# Patient Record
Sex: Male | Born: 1980 | Race: White | Hispanic: No | Smoking: Never smoker
Health system: Southern US, Community
[De-identification: ages and names within clinical notes are randomized; demographics above are authoritative.]

## PROBLEM LIST (undated history)

## (undated) DIAGNOSIS — C179 Malignant neoplasm of small intestine, unspecified: Secondary | ICD-10-CM

## (undated) DIAGNOSIS — J45909 Unspecified asthma, uncomplicated: Secondary | ICD-10-CM

## (undated) DIAGNOSIS — I456 Pre-excitation syndrome: Secondary | ICD-10-CM

## (undated) HISTORY — PX: NEUROFIBROMA EXCISION: SHX2089

## (undated) HISTORY — PX: SMALL INTESTINE SURGERY: SHX150

## (undated) HISTORY — DX: Malignant neoplasm of small intestine, unspecified: C17.9

## (undated) HISTORY — PX: ABDOMINAL SURGERY: SHX537

## (undated) HISTORY — DX: Unspecified asthma, uncomplicated: J45.909

## (undated) HISTORY — PX: CARDIAC ELECTROPHYSIOLOGY MAPPING AND ABLATION: SHX1292

---

## 2021-10-07 NOTE — Progress Notes (Deleted)
  [facility]  [address] [place],  [place]  [address] [phone]  Clinic Day:  10/07/2021  Referring physician: No ref. provider found   HISTORY OF PRESENT ILLNESS:  The patient is a 40 y.o. 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  PAST MEDICAL HISTORY:  No past medical history on file.  PAST SURGICAL HISTORY:  *** The histories are not reviewed yet. Please review them in the "History" navigator section and refresh this [place].  CURRENT MEDICATIONS:   No current outpatient medications on file.   No current facility-administered medications for this visit.    ALLERGIES:  Not on File  FAMILY HISTORY:  No family history on file.  SOCIAL HISTORY:   has no history on file for tobacco use, alcohol use, and drug use.  REVIEW OF SYSTEMS:  Review of Systems - Oncology   PHYSICAL EXAM:  There were no vitals taken for this visit. Wt Readings from Last 3 Encounters:  No data found for Wt   There is no height or weight on file to calculate BMI. Performance status (ECOG): {CHL ONC Q3448304 Physical Exam .phy  LABS:  No flowsheet data found. No flowsheet data found.   No results found for: CEA1 / No results found for: CEA1 No results found for: PSA1 No results found for: JFH545 No results found for: CAN125  No results found for: TOTALPROTELP, ALBUMINELP, A1GS, A2GS, BETS, BETA2SER, GAMS, MSPIKE, SPEI No results found for: TIBC, FERRITIN, IRONPCTSAT No results found for: LDH  No results found for: AFPTUMOR, TOTALPROTELP, ALBUMINELP, A1GS, A2GS, BETS, BETA2SER, GAMS, MSPIKE, SPEI, LDH, CEA1, PSA1, IGASERUM, IGGSERUM, IGMSERUM, THGAB, THYROGLB  Recent Review Flowsheet Data   There is no flowsheet data to display.     STUDIES:  No results found.   ASSESSMENT & PLAN:  A 40 y.o. male who I was asked to consult upon for *** .The patient understands all the plans  discussed today and is in agreement with them.  I do appreciate No ref. provider found for his new consult.   Zniyah Midkiff Macarthur Critchley, MD

## 2021-10-08 ENCOUNTER — Other Ambulatory Visit: Payer: Self-pay

## 2021-10-08 ENCOUNTER — Ambulatory Visit: Payer: Self-pay | Admitting: Oncology

## 2021-10-17 NOTE — Progress Notes (Incomplete)
°  [facility]  [address] [place],  [place]  [address] [phone]  Clinic Day:  10/17/2021  Referring physician: No ref. provider found   HISTORY OF PRESENT ILLNESS:  The patient is a 40 y.o. male  *** who I was asked to consult upon for ***   PAST MEDICAL HISTORY:  No past medical history on file.  PAST SURGICAL HISTORY:  *** The histories are not reviewed yet. Please review them in the "History" navigator section and refresh this [place].  CURRENT MEDICATIONS:   No current outpatient medications on file.   No current facility-administered medications for this visit.    ALLERGIES:  Not on File  FAMILY HISTORY:  No family history on file.  SOCIAL HISTORY:   has no history on file for tobacco use, alcohol use, and drug use.  REVIEW OF SYSTEMS:  Review of Systems - Oncology   PHYSICAL EXAM:  There were no vitals taken for this visit. Wt Readings from Last 3 Encounters:  No data found for Wt   There is no height or weight on file to calculate BMI. Performance status (ECOG): {CHL ONC Q3448304 Physical Exam .phy  LABS:  No flowsheet data found. No flowsheet data found.   No results found for: CEA1 / No results found for: CEA1 No results found for: PSA1 No results found for: WER154 No results found for: CAN125  No results found for: TOTALPROTELP, ALBUMINELP, A1GS, A2GS, BETS, BETA2SER, GAMS, MSPIKE, SPEI No results found for: TIBC, FERRITIN, IRONPCTSAT No results found for: LDH  No results found for: AFPTUMOR, TOTALPROTELP, ALBUMINELP, A1GS, A2GS, BETS, BETA2SER, GAMS, MSPIKE, SPEI, LDH, CEA1, PSA1, IGASERUM, IGGSERUM, IGMSERUM, THGAB, THYROGLB  Recent Review Flowsheet Data   There is no flowsheet data to display.     STUDIES:  No results found.   ASSESSMENT & PLAN:  A 40 y.o. male who I was asked to consult upon for *** .The patient understands all the plans discussed today and is in agreement with  them.  I do appreciate No ref. provider found for his new consult.   Abdulloh Ullom Macarthur Critchley, MD

## 2021-10-18 ENCOUNTER — Other Ambulatory Visit: Payer: Self-pay

## 2021-10-18 ENCOUNTER — Ambulatory Visit: Payer: Self-pay | Admitting: Oncology

## 2021-10-31 ENCOUNTER — Telehealth: Payer: Self-pay | Admitting: Oncology

## 2021-10-31 NOTE — Telephone Encounter (Signed)
Patient reschedule to 12/5 Labs 1:30 pm - Consult for GITS 2:00 pm.  Informed patient it is important that he makes this Appt

## 2021-11-18 ENCOUNTER — Other Ambulatory Visit: Payer: Self-pay | Admitting: Oncology

## 2021-11-18 DIAGNOSIS — C49A3 Gastrointestinal stromal tumor of small intestine: Secondary | ICD-10-CM

## 2021-11-18 NOTE — Progress Notes (Signed)
[facility]  [address] [place],  [place]  [address] [phone]  Clinic Day:  11/19/2021  Referring physician: [facility]   HISTORY OF PRESENT ILLNESS:  The patient is a 40 y.o. 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   His history dates back to July 2021 when he presented to a [place] hospital with severe abdominal pain.  While there, scans showed a small bowel blockage secondary to a suspicious mass.  The patient claims he had to undergo emergency surgery to address his small bowel obstruction.  The pathology from that surgery revealed a mass consistent with GIST tumor.  This gentleman was apparently placed on Gleevec afterwards, which he took until June 2022.  Per outside records, his last CT scan done in February 2022 showed a small nodule adjacent to his sigmoid colon that was concerning for recurrent/persistent disease.  The patient moved to this area in June 2022.  He claims he has not taken his [place] since then.  Despite this, the patient denies having any abdominal pain, changes in his bowel habits, or other GI symptoms which concern him for overt signs of disease recurrence.  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     aspirin EC 81 MG tablet Take 81 mg by mouth daily. Swallow whole.     famotidine (PEPCID) 10 MG tablet Take 10 mg by mouth 2 (two) times daily.     imatinib (GLEEVEC) 100 MG tablet Take 100 mg by mouth daily. Take with meals and large glass of  water.Caution:Chemotherapy     amLODipine (NORVASC) 10 MG tablet Take 10 mg by mouth every morning.     tadalafil (CIALIS) 5 MG tablet Take 5 mg by mouth every morning.     No current facility-administered medications for this visit.    ALLERGIES:  No Known Allergies  FAMILY HISTORY:   Family History  Problem Relation Age of Onset   Leukemia Maternal Uncle    Pancreatic cancer Paternal Uncle    Leukemia Maternal Grandfather   He has a sister with Down syndrome.    SOCIAL HISTORY:  The patient was born and raised in [place], [place].  He currently lives in town by himself.  He is not married and does not have any children.  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  He also worked at a [employer] systems.  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  He drinks an occasional beer.  He denies a history of smoking.  REVIEW OF SYSTEMS:  Review of Systems  Constitutional:  Negative for fatigue, fever and unexpected weight change.  Respiratory:  Negative for chest tightness, cough, hemoptysis and shortness of breath.   Cardiovascular:  Negative for chest pain and palpitations.  Gastrointestinal:  Negative for abdominal distention, abdominal pain, blood in stool, constipation, diarrhea, nausea and vomiting.  Genitourinary:  Negative for dysuria, frequency and hematuria.   Musculoskeletal:  Positive for back pain. Negative for arthralgias and myalgias.  Skin:  Negative for itching and rash.  Neurological:  Negative  for dizziness, headaches and light-headedness.  Psychiatric/Behavioral:  Negative for depression and suicidal ideas. The patient is not nervous/anxious.     PHYSICAL EXAM:  Blood pressure (!) 156/97, pulse 83, temperature 97.9 F (36.6 C), resp. rate 16, height 5\' 6"  (1.676 m), weight 265 lb 11.2 oz (120.5 kg), SpO2 93 %. Wt Readings from Last 3 Encounters:  11/19/21 265 lb 11.2 oz (120.5 kg)   Body mass index is 42.89 kg/m. Performance status (ECOG): 0 -  Asymptomatic Physical Exam Constitutional:      Appearance: Normal appearance. He is not ill-appearing.  HENT:     Mouth/Throat:     Mouth: Mucous membranes are moist.     Pharynx: Oropharynx is clear. No oropharyngeal exudate or posterior oropharyngeal erythema.  Cardiovascular:     Rate and Rhythm: Normal rate and regular rhythm.     Heart sounds: No murmur heard.   No friction rub. No gallop.  Pulmonary:     Effort: Pulmonary effort is normal. No respiratory distress.     Breath sounds: Normal breath sounds. No wheezing, rhonchi or rales.  Abdominal:     General: Bowel sounds are normal. There is no distension.     Palpations: Abdomen is soft. There is no mass.     Tenderness: There is no abdominal tenderness.  Musculoskeletal:        General: No swelling.     Right lower leg: No edema.     Left lower leg: No edema.  Lymphadenopathy:     Cervical: No cervical adenopathy.     Upper Body:     Right upper body: No supraclavicular or axillary adenopathy.     Left upper body: No supraclavicular or axillary adenopathy.     Lower Body: No right inguinal adenopathy. No left inguinal adenopathy.  Skin:    General: Skin is warm.     Coloration: Skin is not jaundiced.     Findings: No lesion or rash.     Comments: Multiple flesh-colored nodules and caf au lait spots seen over his body  Neurological:     General: No focal deficit present.     Mental Status: He is alert and oriented to person, place, and time. Mental status is at baseline.  Psychiatric:        Mood and Affect: Mood normal.        Behavior: Behavior normal.        Thought Content: Thought content normal.    LABS:   CBC Latest Ref Rng & Units 11/19/2021  WBC - 6.4  Hemoglobin 13.5 - 17.5 15.3  Hematocrit 41 - 53 44  Platelets 150 - 399 205   CMP Latest Ref Rng & Units 11/19/2021  BUN 4 - 21 21  Creatinine 0.6 - 1.3 1.2  Sodium 137 - 147 142  Potassium 3.4 - 5.3 4.0  Chloride 99 - 108 106  CO2 13 - 22 26(A)   Calcium 8.7 - 10.7 9.0  Alkaline Phos 25 - 125 64  AST 14 - 40 27  ALT 10 - 40 23   ASSESSMENT & PLAN:  A 40 y.o. 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  Clinically, the patient appears to be doing well.  As mentioned previously, this gentleman has not had a scan since February 2022.  However, at that time, there was a small nodule adjacent to his sigmoid colon that was worrisome for persistent disease.  I  will repeat CT scans in the forthcoming weeks to reassess his disease status.  I do anticipate placing this gentleman back on Gleevec in the immediate future.  Of note, all information gathered today is mostly from this gentleman's recollection.  I do not have his official gist tumor pathology report from [place].  We will try to ascertain this information as quickly as possible.  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  I will have him see genetic counseling to undergo a formal workup to prove this diagnosis.  If he has neurofibromatosis, it will be important for him to undergo the necessary screening procedures to rule out other malignancies which could potentially develop over time.  Otherwise, I will see this patient back in 2 weeks to go over his CT scan images and their implications.  The patient understands all the plans discussed today and is in agreement with them.  I do appreciate the [facility] clinic for this new consult.   Krissi Willaims Macarthur Critchley, MD

## 2021-11-19 ENCOUNTER — Other Ambulatory Visit: Payer: Self-pay | Admitting: Hematology and Oncology

## 2021-11-19 ENCOUNTER — Inpatient Hospital Stay: Payer: Self-pay | Attending: Oncology

## 2021-11-19 ENCOUNTER — Other Ambulatory Visit: Payer: Self-pay | Admitting: Oncology

## 2021-11-19 ENCOUNTER — Encounter: Payer: Self-pay | Admitting: Oncology

## 2021-11-19 ENCOUNTER — Inpatient Hospital Stay (HOSPITAL_BASED_OUTPATIENT_CLINIC_OR_DEPARTMENT_OTHER): Payer: Self-pay | Admitting: Oncology

## 2021-11-19 VITALS — BP 156/97 | HR 83 | Temp 97.9°F | Resp 16 | Ht 66.0 in | Wt 265.7 lb

## 2021-11-19 DIAGNOSIS — Z808 Family history of malignant neoplasm of other organs or systems: Secondary | ICD-10-CM

## 2021-11-19 DIAGNOSIS — Q8500 Neurofibromatosis, unspecified: Secondary | ICD-10-CM

## 2021-11-19 DIAGNOSIS — C49A0 Gastrointestinal stromal tumor, unspecified site: Secondary | ICD-10-CM | POA: Insufficient documentation

## 2021-11-19 DIAGNOSIS — Z806 Family history of leukemia: Secondary | ICD-10-CM

## 2021-11-19 DIAGNOSIS — C49A3 Gastrointestinal stromal tumor of small intestine: Secondary | ICD-10-CM

## 2021-11-19 LAB — CBC AND DIFFERENTIAL
HCT: 44 (ref 41–53)
Hemoglobin: 15.3 (ref 13.5–17.5)
Neutrophils Absolute: 3.65
Platelets: 205 (ref 150–399)
WBC: 6.4

## 2021-11-19 LAB — HEPATIC FUNCTION PANEL
ALT: 23 (ref 10–40)
AST: 27 (ref 14–40)
Alkaline Phosphatase: 64 (ref 25–125)
Bilirubin, Total: 0.6

## 2021-11-19 LAB — CBC: RBC: 5.16 — AB (ref 3.87–5.11)

## 2021-11-19 LAB — BASIC METABOLIC PANEL
BUN: 21 (ref 4–21)
CO2: 26 — AB (ref 13–22)
Chloride: 106 (ref 99–108)
Creatinine: 1.2 (ref 0.6–1.3)
Glucose: 102
Potassium: 4 (ref 3.4–5.3)
Sodium: 142 (ref 137–147)

## 2021-11-19 LAB — COMPREHENSIVE METABOLIC PANEL
Albumin: 4.6 (ref 3.5–5.0)
Calcium: 9 (ref 8.7–10.7)

## 2021-11-20 ENCOUNTER — Telehealth: Payer: Self-pay | Admitting: Genetic Counselor

## 2021-11-20 NOTE — Telephone Encounter (Signed)
Scheduled appt per 12/5 referral. Pt is aware of appt date and time. Pt preferred in person visit in [place].

## 2021-11-20 NOTE — Progress Notes (Signed)
Called and spoke with patient about applying for financial assistance through [facility] and [facility]. He will be in Friday, 11/23/2021, to fill out paperwork and bring in his financial paperwork.

## 2021-11-21 ENCOUNTER — Telehealth: Payer: Self-pay

## 2021-11-21 ENCOUNTER — Other Ambulatory Visit (HOSPITAL_COMMUNITY): Payer: Self-pay

## 2021-11-21 NOTE — Telephone Encounter (Signed)
Oral Oncology Pharmacist Encounter  Received new prescription for imatinib (Gleevec) for the treatment of gastrointestinal stromal tumor, planned duration until disease progression or unacceptable toxicity.  Labs from 11/19/21 assessed, no interventions needed.  Per MD note, patient has been on [place] prior although hasn't taken the medication for months.  Current medication list in Epic reviewed, DDIs with Gleevec identified: -amlodipine: gleevec can increase the concentration of amlodipine and will need to be monitored for increased side effects like hypotension and edema. -tadalafil: gleevec can increase the concentraiton of tadalafil and will need to be monitored for increased adverse effects.   Evaluated chart and no patient barriers to medication adherence noted.   Patient agreement for treatment documented in MD note on 11/19/2021.  Prescription has been e-scribed to the [facility] for benefits analysis and approval.  Oral Oncology Clinic will continue to follow for insurance authorization, copayment issues, initial counseling and start date.  Drema Halon, PharmD Hematology/Oncology Clinical Pharmacist [facility] [phone] 11/21/2021 2:19 PM

## 2021-11-27 ENCOUNTER — Telehealth: Payer: Self-pay | Admitting: Pharmacy Technician

## 2021-11-27 NOTE — Telephone Encounter (Signed)
Patient is uninsured. And there currently are no patient assistance available for Gleevac since it has gone generic.  Per [facility] website, patient could get 1 month supply via mail for $39.00.  Patient would need to enroll for program and provide office with his email address used to sign up. Email must be included on prescription when sent to the pharmacy.  Called patient to discuss, left voicemail.

## 2021-11-28 DIAGNOSIS — Q8500 Neurofibromatosis, unspecified: Secondary | ICD-10-CM | POA: Insufficient documentation

## 2021-11-28 NOTE — Telephone Encounter (Signed)
ACP, no answer/ left message to return call

## 2021-11-29 NOTE — Telephone Encounter (Signed)
Spoke to patient, he has insurance open enrollment for his job in February. Patient will like to sign up for [facility] pharmacy for the interim.   Emailed patient the [facility] pharmacy website link to sign up- [email or web address].

## 2021-11-29 NOTE — Progress Notes (Incomplete)
[facility]  [address] [place],  [place]  [address] [phone]  Clinic Day:  12/04/2021  Referring physician: [facility]  This document serves as a record of services personally performed by Marice Potter, MD. It was created on their behalf by Curry,Lauren E, a trained medical scribe. The creation of this record is based on the scribe's personal observations and the provider's statements to them.  HISTORY OF PRESENT ILLNESS:  The patient is a 40 y.o. 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   His history dates back to July 2021 when he presented to a [place] hospital with severe abdominal pain.  While there, scans showed a small bowel blockage secondary to a suspicious mass.  The patient claims he had to undergo emergency surgery to address his small bowel obstruction.  The pathology from that surgery revealed a mass consistent with GIST tumor.  This gentleman was apparently placed on Gleevec afterwards, which he took until June 2022.  Per outside records, his last CT scan done in February 2022 showed a small nodule adjacent to his sigmoid colon that was concerning for recurrent/persistent disease.  The patient moved to this area in June 2022.  He claims he has not taken his [place] since then.  Despite this, the patient denies having any abdominal pain, changes in his bowel habits, or other GI symptoms which concern him for overt signs of disease recurrence.  PHYSICAL EXAM:  There were no vitals taken for this visit. Wt Readings from Last 3 Encounters:  11/19/21 265 lb 11.2 oz (120.5 kg)   There is no height or weight on file to calculate BMI. Performance status (ECOG): 0 - Asymptomatic Physical Exam Constitutional:      Appearance: Normal appearance. He is not ill-appearing.  HENT:     Mouth/Throat:     Mouth: Mucous membranes are moist.     Pharynx: Oropharynx is  clear. No oropharyngeal exudate or posterior oropharyngeal erythema.  Cardiovascular:     Rate and Rhythm: Normal rate and regular rhythm.     Heart sounds: No murmur heard.   No friction rub. No gallop.  Pulmonary:     Effort: Pulmonary effort is normal. No respiratory distress.     Breath sounds: Normal breath sounds. No wheezing, rhonchi or rales.  Abdominal:     General: Bowel sounds are normal. There is no distension.     Palpations: Abdomen is soft. There is no mass.     Tenderness: There is no abdominal tenderness.  Musculoskeletal:        General: No swelling.     Right lower leg: No edema.     Left lower leg: No edema.  Lymphadenopathy:     Cervical: No cervical adenopathy.     Upper Body:     Right upper body: No supraclavicular or axillary adenopathy.     Left upper body: No supraclavicular or axillary adenopathy.     Lower Body: No right inguinal adenopathy. No left inguinal adenopathy.  Skin:    General: Skin is warm.     Coloration: Skin is not jaundiced.     Findings: No lesion or rash.     Comments: Multiple flesh-colored nodules and caf au lait spots seen over his body  Neurological:     General: No focal deficit present.     Mental Status: He is alert and oriented to person, place, and time. Mental  status is at baseline.  Psychiatric:        Mood and Affect: Mood normal.        Behavior: Behavior normal.        Thought Content: Thought content normal.    LABS:   CBC Latest Ref Rng & Units 11/19/2021  WBC - 6.4  Hemoglobin 13.5 - 17.5 15.3  Hematocrit 41 - 53 44  Platelets 150 - 399 205   CMP Latest Ref Rng & Units 11/19/2021  BUN 4 - 21 21  Creatinine 0.6 - 1.3 1.2  Sodium 137 - 147 142  Potassium 3.4 - 5.3 4.0  Chloride 99 - 108 106  CO2 13 - 22 26(A)  Calcium 8.7 - 10.7 9.0  Alkaline Phos 25 - 125 64  AST 14 - 40 27  ALT 10 - 40 23   ASSESSMENT & PLAN:  A 40 y.o. male who I was asked to consult upon for the continued management of his small  bowel GIST tumor.  Clinically, the patient appears to be doing well.  As mentioned previously, this gentleman has not had a scan since February 2022.  However, at that time, there was a small nodule adjacent to his sigmoid colon that was worrisome for persistent disease.  I will repeat CT scans in the forthcoming weeks to reassess his disease status.  I do anticipate placing this gentleman back on Gleevec in the immediate future.  Of note, all information gathered today is mostly from this gentleman's recollection.  I do not have his official gist tumor pathology report from [place].  We will try to ascertain this information as quickly as possible.  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  I will have him see genetic counseling to undergo a formal workup to prove this diagnosis.  If he has neurofibromatosis, it will be important for him to undergo the necessary screening procedures to rule out other malignancies which could potentially develop over time.  Otherwise, I will see this patient back in 2 weeks to go over his CT scan images and their implications.  The patient understands all the plans discussed today and is in agreement with them.  I, Rita Ohara, am acting as scribe for Marice Potter, MD    I have reviewed this report as typed by the medical scribe, and it is complete and accurate.  Dequincy Macarthur Critchley, MD

## 2021-11-30 NOTE — Telephone Encounter (Signed)
Spoke to patient, he advised that he was able to set up his [employer] [facility] account. Sent notification to pharmacist to advise clinic can send in prescription when they are ready for patient to start.

## 2021-12-03 NOTE — Progress Notes (Signed)
Spoke with patient on the phone today, he has not made it in to fill out his financial assistance paperwork for [place] and [facility]. He is out of town at the moment, and said he would come in after the holidays to met with me.  °

## 2021-12-04 ENCOUNTER — Ambulatory Visit: Payer: Self-pay | Admitting: Oncology

## 2021-12-18 ENCOUNTER — Inpatient Hospital Stay: Payer: Self-pay | Attending: Oncology | Admitting: Genetic Counselor

## 2021-12-18 ENCOUNTER — Inpatient Hospital Stay: Payer: Self-pay

## 2021-12-25 NOTE — Progress Notes (Signed)
Called patient and reached his voicemail, left a message asking him to call me back.

## 2021-12-25 NOTE — Telephone Encounter (Signed)
Oral Oncology Pharmacist Encounter  Patient has not rescheduled appointment or received new scans. Per MD, patient will not restart gleevec until after the scans are reviewed and MD sees patient.   Will close encounter for the moment until medication course is decided.   Drema Halon, PharmD Hematology/Oncology Clinical Pharmacist [facility] [phone]

## 2021-12-31 ENCOUNTER — Telehealth: Payer: Self-pay | Admitting: Oncology

## 2021-12-31 NOTE — Progress Notes (Signed)
Patient was in today, we filled out paperwork for financial assistance for [facility] health and [facility]. He provided me with his pay stubs, but I will still need his 1040, bank statements, and a bill in his name. He said that he would bring this back in to me once he has it.

## 2021-12-31 NOTE — Telephone Encounter (Signed)
Patient came in today to schedule his CT scans as well his provider appt. Pt has been r/s'd for all appts. He was given information for CT and given appt dates and times for his follow-up with an appt calendar.

## 2021-12-31 NOTE — Progress Notes (Signed)
Sent in financial application to [facility] and [employer].

## 2022-01-01 NOTE — Progress Notes (Signed)
[place]  [address] [place],    [id number] [phone]  Clinic Day:  01/07/2022  Referring physician: [facility]  This document serves as a record of services personally performed by Marice Potter, MD. It was created on their behalf by Curry,Lauren E, a trained medical scribe. The creation of this record is based on the scribe's personal observations and the provider's statements to them.  HISTORY OF PRESENT ILLNESS:  The patient is a 41 y.o. 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  This gentleman was placed on Gleevec afterwards, which he took until June 2022, which is around the time he moved to this area.  He comes in today to go over his CT scans to ascertain his new disease baseline.  Per outside records, his last CT scan done in February 2022 showed a small nodule adjacent to his sigmoid colon that was concerning for recurrent/persistent disease.  Overall, the patient has been doing well.  He denies having any new GI symptoms which concern him for disease recurrence.    PHYSICAL EXAM:  Blood pressure (!) 142/91, pulse 73, temperature 97.8 F (36.6 C), resp. rate 16, height 5\' 6"  (1.676 m), weight 265 lb 6.4 oz (120.4 kg), SpO2 96 %. Wt Readings from Last 3 Encounters:  01/07/22 265 lb 6.4 oz (120.4 kg)  11/19/21 265 lb 11.2 oz (120.5 kg)   Body mass index is 42.84 kg/m. Performance status (ECOG): 0 - Asymptomatic Physical Exam Constitutional:      Appearance: Normal appearance. He is not ill-appearing.  HENT:     Mouth/Throat:     Mouth: Mucous membranes are moist.     Pharynx: Oropharynx is clear. No oropharyngeal exudate or posterior oropharyngeal erythema.  Cardiovascular:     Rate and Rhythm: Normal rate and regular rhythm.     Heart sounds: No murmur heard.   No friction rub. No gallop.  Pulmonary:      Effort: Pulmonary effort is normal. No respiratory distress.     Breath sounds: Normal breath sounds. No wheezing, rhonchi or rales.  Abdominal:     General: Bowel sounds are normal. There is no distension.     Palpations: Abdomen is soft. There is no mass.     Tenderness: There is no abdominal tenderness.  Musculoskeletal:        General: No swelling.     Right lower leg: No edema.     Left lower leg: No edema.  Lymphadenopathy:     Cervical: No cervical adenopathy.     Upper Body:     Right upper body: No supraclavicular or axillary adenopathy.     Left upper body: No supraclavicular or axillary adenopathy.     Lower Body: No right inguinal adenopathy. No left inguinal adenopathy.  Skin:    General: Skin is warm.     Coloration: Skin is not jaundiced.     Findings: No lesion or rash.     Comments: Multiple flesh-colored nodules and caf au lait spots seen over his body  Neurological:     General: No focal deficit present.     Mental Status: He is alert and oriented to person, place, and time. Mental status is at baseline.  Psychiatric:        Mood and Affect: Mood normal.        Behavior: Behavior normal.  Thought Content: Thought content normal.   SCANS: Recent CT imaging has revealed the following:  FINDINGS: CT CHEST FINDINGS Cardiovascular: No significant vascular findings. Normal heart size. No pericardial effusion. Mediastinum/Nodes: No enlarged mediastinal, hilar, or axillary lymph nodes. Thyroid gland, trachea, and esophagus demonstrate no significant findings. Lungs/Pleura: Lungs are clear. No pleural effusion or pneumothorax. Musculoskeletal: No chest wall mass or suspicious osseous lesions identified.  CT ABDOMEN PELVIS FINDINGS Hepatobiliary: No solid liver abnormality is seen. No gallstones, gallbladder wall thickening, or biliary dilatation. Pancreas: Unremarkable. No pancreatic ductal dilatation or surrounding inflammatory changes. Spleen: Normal  in size without significant abnormality. Adrenals/Urinary Tract: Adrenal glands are unremarkable. Multiple small bilateral simple renal cysts. Kidneys are otherwise normal, without renal calculi, solid lesion, or hydronephrosis. Bladder is unremarkable. Stomach/Bowel: Stomach is within normal limits. Appendix appears normal. No evidence of bowel wall thickening, distention, or inflammatory changes. Vascular/Lymphatic: No significant vascular findings are present. Multiple prominent lymph nodes in the central small bowel mesentery, measuring up to 1.4 x 1.0 cm (series 2, image 69). No overtly enlarged abdominal or pelvic lymph nodes. Reproductive: No mass or other abnormality. Other: Small, fat containing midline ventral epigastric hernia (series 2, image 76). Small, fat containing umbilical hernia (series 2, image 84). Small, fat containing left inguinal hernia (series 2, image 119). No ascites. Musculoskeletal: No acute osseous findings.  IMPRESSION: 1. No specific evidence of recurrent or metastatic GIST in the chest, abdomen, or pelvis. Comparison to prior imaging and nature of reported prior surgery may be helpful. 2. Multiple prominent lymph nodes in the central small bowel mesentery, nonspecific. As above, comparison to prior imaging may be helpful to assess for change. No overtly enlarged lymph nodes in the chest, abdomen, or pelvis.  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  In clinic today, I went over all of his CT scan images with him, for which he could see there is no evidence of any disease recurrence.  As mentioned previously, this gentleman did take [place] for approximately 1 year after his surgery.  From my vantage point, as he is disease-free, I see no need for him to get back on [place] therapy.  Clinically, he is doing well.  From a surveillance standpoint, I will see him back in 6 months for repeat clinical assessment.  CT scans will  be done a day before his next visit to ensure there remains no radiographic evidence of disease recurrence.  The patient understands all the plans discussed today and is in agreement with them.   I, Rita Ohara, am acting as scribe for Marice Potter, MD    I have reviewed this report as typed by the medical scribe, and it is complete and accurate.  Navy Rothschild Macarthur Critchley, MD

## 2022-01-07 ENCOUNTER — Other Ambulatory Visit: Payer: Self-pay

## 2022-01-07 ENCOUNTER — Other Ambulatory Visit: Payer: Self-pay | Admitting: Oncology

## 2022-01-07 ENCOUNTER — Inpatient Hospital Stay (HOSPITAL_BASED_OUTPATIENT_CLINIC_OR_DEPARTMENT_OTHER): Payer: Self-pay | Admitting: Oncology

## 2022-01-07 VITALS — BP 142/91 | HR 73 | Temp 97.8°F | Resp 16 | Ht 66.0 in | Wt 265.4 lb

## 2022-01-07 DIAGNOSIS — C49A3 Gastrointestinal stromal tumor of small intestine: Secondary | ICD-10-CM

## 2022-01-15 ENCOUNTER — Encounter: Payer: Self-pay | Admitting: Oncology

## 2022-01-18 ENCOUNTER — Inpatient Hospital Stay: Payer: Self-pay

## 2022-01-18 ENCOUNTER — Inpatient Hospital Stay: Payer: Self-pay | Attending: Oncology | Admitting: Genetic Counselor

## 2022-01-18 ENCOUNTER — Other Ambulatory Visit: Payer: Self-pay

## 2022-01-18 ENCOUNTER — Other Ambulatory Visit: Payer: Self-pay | Admitting: Genetic Counselor

## 2022-01-18 DIAGNOSIS — Z800 Family history of malignant neoplasm of digestive organs: Secondary | ICD-10-CM

## 2022-01-18 DIAGNOSIS — C49A3 Gastrointestinal stromal tumor of small intestine: Secondary | ICD-10-CM

## 2022-01-18 NOTE — Addendum Note (Signed)
Addended by: Ignacia Bayley on: 01/18/2022 10:27 AM   Modules accepted: Orders

## 2022-01-27 ENCOUNTER — Encounter: Payer: Self-pay | Admitting: Genetic Counselor

## 2022-01-27 DIAGNOSIS — Z800 Family history of malignant neoplasm of digestive organs: Secondary | ICD-10-CM

## 2022-01-27 HISTORY — DX: Family history of malignant neoplasm of digestive organs: Z80.0

## 2022-01-27 NOTE — Progress Notes (Signed)
REFERRING PROVIDER: Weston Settle, MD [address] [place],  [place] [address]  PRIMARY PROVIDER:  Healthcare, [facility]  PRIMARY REASON FOR VISIT:  1. 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 Family history of pancreatic cancer     HISTORY OF PRESENT ILLNESS:   Ryan Waters, a 41 y.o. male, was seen for a  cancer genetics consultation at the request of Dr. Melvyn Neth due to a personal and family history of cancer and a personal history of neurofibromas.  Ryan Waters presents to clinic today to discuss the possibility of a hereditary predisposition to cancer, to discuss genetic testing, and to further clarify his future cancer risks, as well as potential cancer risks for family members.   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  He took Gleevec until June 2022.  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  He reports several coffee-colored birth marks on his chest.  He reports having brain imaging as  child but does not recall the outcome.    Past Medical History:  Diagnosis Date   Asthma    Family history of pancreatic cancer 01/27/2022   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HCC)     FAMILY HISTORY:  We obtained a detailed, 4-generation family history.  Significant diagnoses are listed below: Family History  Problem Relation Age of Onset   Cancer Mother        unknown type; dx <50   Leukemia Maternal Uncle        d. 5   Pancreatic cancer Paternal Uncle        dx unknown age   Leukemia Maternal Grandfather        dx unknown age     Ryan Waters had limited information about his maternal and paternal family history. Ryan Waters is unaware of previous family history of genetic testing for hereditary cancer risks. There is no reported Ashkenazi Jewish ancestry. There is no  known consanguinity.  GENETIC COUNSELING ASSESSMENT: Ryan Waters is a 41 y.o. male with a personal and family history of cancer which is somewhat suggestive of a hereditary cancer syndrome given his age of diagnosis and the presence of pancreatic cancer in the family. aWe, therefore, discussed and recommended the following at today's visit.   DISCUSSION: We discussed that 5 - 10% of cancer is hereditary.  Most cases of hereditary pancreatic cancer associated with mutations in BRCA1/2.  There are other genes that can be associated with hereditary GIST syndromes.  Given his history of neurofibromas, we also discussed syndromes associated with neurofibromas. We discussed that testing is beneficial for several reasons including knowing how to follow individuals for their cancer and other medical risks and understanding if other family members could be at risk for cancer and allowing them to undergo genetic testing.   We reviewed the characteristics, features and inheritance patterns of hereditary cancer syndromes. We also discussed genetic testing, including the appropriate family members to test, the process of testing, insurance coverage and turn-around-time for results. We discussed the implications of a negative, positive, carrier and/or variant of uncertain significant result. We recommended Ryan Waters pursue genetic testing for a panel that includes genes associated with GIST, pancreatic cancer, and neurofibromas.    The Multi-Cancer + RNA Panel offered by [facility] includes sequencing and/or deletion/duplication analysis of the following 84 genes:  AIP*, ALK, APC*, ATM*, AXIN2*, BAP1*, BARD1*, BLM*, BMPR1A*, BRCA1*, BRCA2*, BRIP1*, CASR, CDC73*, CDH1*, CDK4, CDKN1B*, CDKN1C*, CDKN2A, CEBPA, CHEK2*, CTNNA1*, DICER1*, DIS3L2*, EGFR, EPCAM, FH*, FLCN*, GATA2*, GPC3, GREM1, HOXB13, HRAS, KIT, MAX*, MEN1*, MET, MITF, MLH1*, MSH2*, MSH3*, MSH6*, MUTYH*, NBN*, NF1*, NF2*, NTHL1*, PALB2*, PDGFRA, PHOX2B, PMS2*,  POLD1*, POLE*, POT1*, PRKAR1A*, PTCH1*, PTEN*, RAD50*, RAD51C*, RAD51D*, RB1*, RECQL4, RET, RUNX1*, SDHA*, SDHAF2*, SDHB*, SDHC*, SDHD*, SMAD4*, SMARCA4*, SMARCB1*, SMARCE1*, STK11*, SUFU*, TERC, TERT, TMEM127*, Tp53*, TSC1*, TSC2*, VHL*, WRN*, and WT1.  RNA analysis is performed for * genes.  Based on Ryan Waters family history of pancreatic cancer, he meets medical criteria for genetic testing. Ryan Waters applied for the patient pay assistance program (genetic testing fee waiver) through [place].    We discussed that a workup for neurofibromatosis would be most appropriate through a medical geneticist.  He agreed to a referral upon the return of these results.   PLAN: After considering the risks, benefits, and limitations, Ryan Waters provided informed consent to pursue genetic testing and the blood sample was sent to [facility] for analysis of the Multi-Cancer +RNA Panel. Results should be available within approximately 3 weeks' time, at which point they will be disclosed by telephone to Ryan Waters, as will any additional recommendations warranted by these results. Ryan Waters will receive a summary of his genetic counseling visit and a copy of his results once available. This information will also be available in Epic.   Lastly, we encouraged Ryan Waters to remain in contact with cancer genetics annually so that we can continuously update the family history and inform him of any changes in cancer genetics and testing that may be of benefit for this family.   Ryan Waters questions were answered to his satisfaction today. Our contact information was provided should additional questions or concerns arise. Thank you for the referral and allowing [place] to share in the care of your patient.   Iviona Hole M. Joette Catching, [place], [facility] Genetic Counselor [email or web address] (P) [phone]  The patient was seen for a total of 35 minutes in face-to-face genetic counseling.  The patient was seen alone.  Drs.  Lindi Adie and/or Burr Medico were available to discuss this case as needed.    _______________________________________________________________________ For Office Staff:  Number of people involved in session: 1 Was an Intern/ student involved with case: no

## 2022-03-01 ENCOUNTER — Encounter: Payer: Self-pay | Admitting: Genetic Counselor

## 2022-03-01 ENCOUNTER — Telehealth: Payer: Self-pay | Admitting: Genetic Counselor

## 2022-03-01 DIAGNOSIS — Z1379 Encounter for other screening for genetic and chromosomal anomalies: Secondary | ICD-10-CM | POA: Insufficient documentation

## 2022-03-01 NOTE — Telephone Encounter (Signed)
Contacted patient in attempt to disclose results of genetic testing.  LVM with contact information requesting a call back.  

## 2022-03-07 ENCOUNTER — Ambulatory Visit: Payer: Self-pay | Admitting: Genetic Counselor

## 2022-03-07 DIAGNOSIS — Z1379 Encounter for other screening for genetic and chromosomal anomalies: Secondary | ICD-10-CM

## 2022-03-07 DIAGNOSIS — C49A3 Gastrointestinal stromal tumor of small intestine: Secondary | ICD-10-CM

## 2022-03-07 DIAGNOSIS — Z1589 Genetic susceptibility to other disease: Secondary | ICD-10-CM

## 2022-03-07 DIAGNOSIS — Z800 Family history of malignant neoplasm of digestive organs: Secondary | ICD-10-CM

## 2022-03-07 NOTE — Telephone Encounter (Signed)
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  Agreed to referral at [facility] NF multidisciplinary clinic.  ? ? ?

## 2022-03-07 NOTE — Telephone Encounter (Signed)
Contacted patient in attempt to disclose results of genetic testing.  LVM with contact information requesting a call back.  Second attempt.  

## 2022-03-27 ENCOUNTER — Encounter: Payer: Self-pay | Admitting: Genetic Counselor

## 2022-03-27 DIAGNOSIS — Z1589 Genetic susceptibility to other disease: Secondary | ICD-10-CM

## 2022-03-27 HISTORY — DX: Genetic susceptibility to other disease: Z15.89

## 2022-03-27 NOTE — Progress Notes (Signed)
HPI:   ?Mr. Duguay was previously seen in the [facility] clinic due to a personal and family history of cancer, a personal history of neurofibromas, and concerns regarding a hereditary predisposition to cancer. Please refer to our prior cancer genetics clinic note for more information regarding our discussion, assessment and recommendations, at the time. Mr. Norrod recent genetic test results were disclosed to him, as were recommendations warranted by these results. These results and recommendations are discussed in more detail below. ? ?Mr. 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  He took [place] until June 2022.  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  He reports several coffee-colored birth marks on his chest.  He reports having brain imaging as child but does not recall the outcome.  ? ? ?FAMILY HISTORY:  ?We obtained a detailed, 4-generation family history.  Significant diagnoses are listed below: ?     ?Family History  ?Problem Relation Age of Onset  ? Cancer Mother    ?      unknown type; dx <50  ? Leukemia Maternal Uncle    ?      d. 5  ? Pancreatic cancer Paternal Uncle    ?      dx unknown age  ? Leukemia Maternal Grandfather    ?      dx unknown age  ?  ?  ?Mr. Giampietro had limited information about his maternal and paternal family history. Mr. Delaguila is unaware of previous family history of genetic testing for hereditary cancer risks. There is no reported Ashkenazi Jewish ancestry. There is no known consanguinity. ? 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   ? ?The test report has been scanned into EPIC and is located under the Molecular Pathology section of the Results Review tab.  A portion of the result  report is included below for reference. Genetic testing reported out on February 27, 2022.  ? ? ?Genetic testing did identify a variant of uncertain significance (VUS) in the NBN gene called c.2215C>G (p.Leu739Val).  At this time, it is unknown if this variant is associated with increased cancer risk or if this is a normal finding, but most variants such as this get reclassified to being inconsequential. It should not be used to make medical management decisions. With time, we suspect the lab will determine the significance of this variant, if any. If we do learn more about it, we will try to contact Mr. Fleischer to discuss it further. However, it is important to stay in touch with [place] periodically and keep the address and phone number up to date. ? ?Of note, these results do not fully explain the family history of pancreatic cancer.  It could be sporadic/famillial, due to a change in a gene that he did not inherit, due to a different gene that we are not testing, or maybe our current technology may not be able to pick something up.  We recommend genetic testing of his family member affected with pancreatic cancer.  ? ? ?NF1 Clinical Condition ?The NF1 gene is associated with autosomal dominant neurofibromatosis type 1, neurofibromatosis-Noonan syndrome, and Watson syndrome. Additionally, evidence of varying degrees suggests a possible association between the NF1 gene and several cancer types. ? ?NF1 is a neurocutaneous condition  with extremely variable clinical features that increase in frequency with age and include multiple cafe-au-lait spots, axillary and inguinal freckling, iris Lisch nodules, choroidal freckling, optic gliomas, and cutaneous neurofibromas. Plexiform neurofibromas develop in approximately half of individuals with NF1; however, many occur internally and therefore escape clinical detection unless they become symptomatic. Nevus anemicus and juvenile xanthogranuloma are more common in individuals with NF1  than in the general population. ? ?Approximately 50% of individuals diagnosed with NF1 have been noted to exhibit learning disabilities. More significant intellectual disability has been observed in 6-7% of individuals with NF1, while autistic spectrum disorder has been noted in approximately 30% of children with NF1. Individuals with NF1 often have a larger head circumference than members of the general population and tend to be below average in height. ? ?Some individuals with NF1 develop polyneuropathy from multiple nerve root tumors, which can also result in a higher risk for malignant peripheral nerve sheath tumors. Individuals with NF1 are at a higher risk for seizures and osteopenia/osteoporosis than those in the general population. Long bone dysplasia, typically congenital anterolateral bowing of the lower leg, pseudarthrosis, scoliosis, sphenoid wing dysplasia, and vertebral dysplasia have all been associated with NF1. ? ?Vasculopathy and cardiac issues associated with NF1 can include pulmonary valve stenosis, hypertrophic cardiomyopathy, pulmonary hypertension, renal artery stenosis, coarctation of the aorta, stroke, stenotic or ectatic cerebral arteries, moyamoya disease, and intracranial aneurysms. ? ?There are risks of various malignancies, including peripheral nerve sheath tumors, optic gliomas, brain tumors, pheochromocytomas, and gastrointestinal stromal tumors. Recent evidence suggests that there is also an increased risk of adult-onset breast cancer in women. ? ?NFNS is clinically variable and associated with features found in NF1 along with features of Noonan syndrome, such as short stature, ptosis, midface hypoplasia, pulmonic stenosis, learning disabilities, pectus anomalies, webbed neck, and muscle weakness. ? ?Watson syndrome is characterized by short stature, cafe-au-lait spots, Lisch nodules, axillary freckling, intellectual disability, pulmonary valvular stenosis, and relative macrocephaly.  Neurofibromas have been observed in up to 33% of patients with Watson syndrome. ? ? ?Inheritance ? ?NF1, NFNS, and Watson syndrome are all inherited as autosomal dominant conditions. This means that an individual with a pathogenic variant in NF1 has a 50% chance of passing the variant to their offspring. However, due to the extreme variability of these conditions, it is not possible to predict the specific phenotype that will be exhibited by the offspring. Approximately half of NF1 cases are inherited, and the remainder is the result of de novo pathogenic variants. Germline mosaicism for NF1 has been demonstrated. ? ? ?Management ?Due to the multisystemic nature of these conditions, recommended management for individuals with NF1, NFNS, and Watson syndrome is ideally performed in an NF specialty clinic. If that is not possible, the care team should consist of a geneticist/genetic counselor, a neurologist, an ophthalmologist, a dermatologist, a cardiologist, and potentially an orthopedist and an oncologist. ? ?Initial evaluations upon diagnosis should include the following: ? ?Obtain a thorough medical history with a specific focus on features associated with NF1 ?Provide a thorough physical exam to evaluate for the presence of neurocutaneous, cardiovascular, and musculoskeletal features, tumors, and any other relevant findings  ?Obtain a detailed evaluation by an ophthalmologist  ?Obtain a developmental assessment  ?Obtain molecular testing of the proband's parents to determine whether the proband has inherited the NF1 variant or if it has occurred de novo  ? ?Surveillance needs to include the following: ?Annual evaluation at an NF specialty center or by a physician experienced in  treating patients with NF1  ?For children, regular developmental evaluations throughout childhood and implementation of additional support services, such as individualized education plans, occupational therapy, and behavioral therapy, when  indicated ?Annual ophthalmologic exam throughout childhood and less frequent evaluations in adulthood  ?Regular blood pressure monitoring ?MRI or other imaging studies as indicated, based on clinical

## 2022-04-30 ENCOUNTER — Emergency Department
Admission: EM | Admit: 2022-04-30 | Discharge: 2022-04-30 | Disposition: A | Discharge status: Home / Self Care | Payer: BC Managed Care – PPO | Attending: Emergency Medicine | Admitting: Emergency Medicine

## 2022-04-30 ENCOUNTER — Emergency Department: Payer: BC Managed Care – PPO

## 2022-04-30 ENCOUNTER — Other Ambulatory Visit: Payer: Self-pay

## 2022-04-30 DIAGNOSIS — G510 Bell's palsy: Secondary | ICD-10-CM | POA: Diagnosis not present

## 2022-04-30 DIAGNOSIS — R29810 Facial weakness: Secondary | ICD-10-CM | POA: Diagnosis present

## 2022-04-30 DIAGNOSIS — R209 Unspecified disturbances of skin sensation: Secondary | ICD-10-CM | POA: Diagnosis not present

## 2022-04-30 DIAGNOSIS — H5789 Other specified disorders of eye and adnexa: Secondary | ICD-10-CM | POA: Insufficient documentation

## 2022-04-30 LAB — COMPREHENSIVE METABOLIC PANEL
ALT: 18 U/L (ref 0–44)
AST: 19 U/L (ref 15–41)
Albumin: 5.1 g/dL — ABNORMAL HIGH (ref 3.5–5.0)
Alkaline Phosphatase: 58 U/L (ref 38–126)
Anion gap: 13 (ref 5–15)
BUN: 25 mg/dL — ABNORMAL HIGH (ref 6–20)
CO2: 20 mmol/L — ABNORMAL LOW (ref 22–32)
Calcium: 9.5 mg/dL (ref 8.9–10.3)
Chloride: 105 mmol/L (ref 98–111)
Creatinine, Ser: 1.02 mg/dL (ref 0.61–1.24)
GFR, Estimated: >60 mL/min (ref >60)
Glucose, Bld: 103 mg/dL — ABNORMAL HIGH (ref 70–99)
Potassium: 3.4 mmol/L — ABNORMAL LOW (ref 3.5–5.1)
Sodium: 138 mmol/L (ref 135–145)
Total Bilirubin: 1.3 mg/dL — ABNORMAL HIGH (ref 0.3–1.2)
Total Protein: 8.3 g/dL — ABNORMAL HIGH (ref 6.5–8.1)

## 2022-04-30 LAB — CBC
HCT: 46.9 % (ref 39.0–52.0)
Hemoglobin: 16.3 g/dL (ref 13.0–17.0)
MCH: 29.1 pg (ref 26.0–34.0)
MCHC: 34.8 g/dL (ref 30.0–36.0)
MCV: 83.6 fL (ref 80.0–100.0)
Platelets: 224 10*3/uL (ref 150–400)
RBC: 5.61 MIL/uL (ref 4.22–5.81)
RDW: 12.9 % (ref 11.5–15.5)
WBC: 9.6 10*3/uL (ref 4.0–10.5)
nRBC: 0 % (ref 0.0–0.2)

## 2022-04-30 LAB — DIFFERENTIAL
Abs Immature Granulocytes: 0.04 10*3/uL (ref 0.00–0.07)
Basophils Absolute: 0.1 10*3/uL (ref 0.0–0.1)
Basophils Relative: 1 %
Eosinophils Absolute: 0.2 10*3/uL (ref 0.0–0.5)
Eosinophils Relative: 2 %
Immature Granulocytes: 0 %
Lymphocytes Relative: 25 %
Lymphs Abs: 2.4 10*3/uL (ref 0.7–4.0)
Monocytes Absolute: 0.8 10*3/uL (ref 0.1–1.0)
Monocytes Relative: 9 %
Neutro Abs: 6.1 10*3/uL (ref 1.7–7.7)
Neutrophils Relative %: 63 %

## 2022-04-30 IMAGING — CT CT HEAD W/O CM
4 series · 16 of 47 positions shown, 18 images · non-contrast
Comparison: None Available.

CLINICAL DATA: Neuro deficit, acute, stroke suspected



[Series 2: head wo · axial · 0.41mm/px · z∈[-140,-26]mm · 7 of 31 slices shown, 9 images]
[im 4/31  brain]
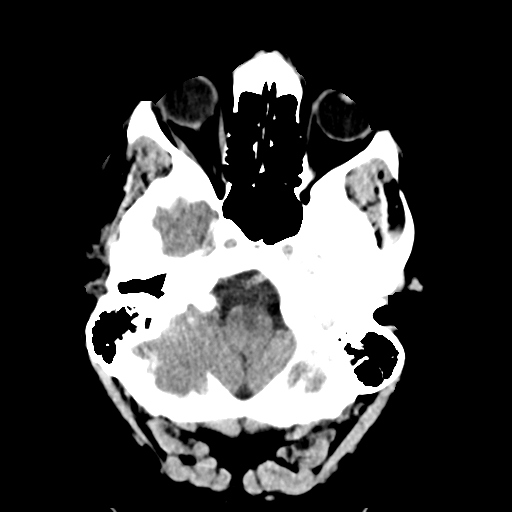
[im 4/31  bone]
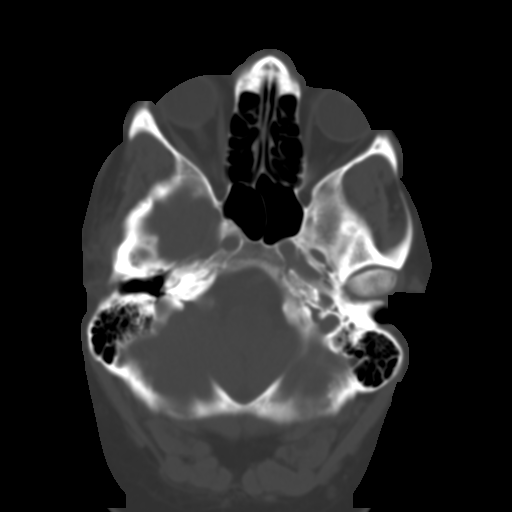
[im 8/31  brain]
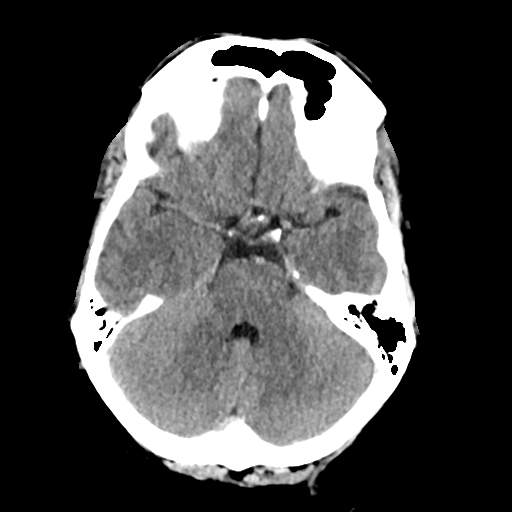
[im 12/31  brain]
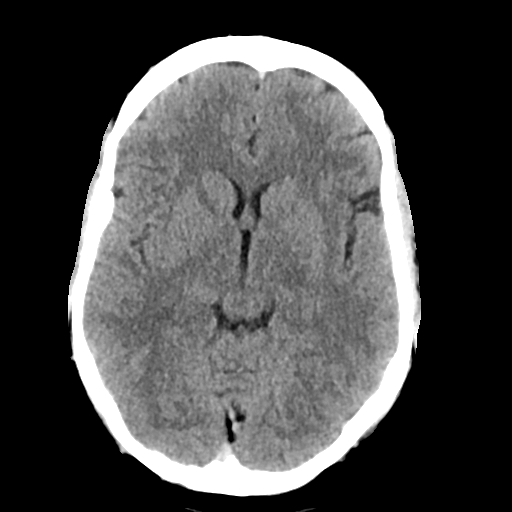
[im 16/31  brain]
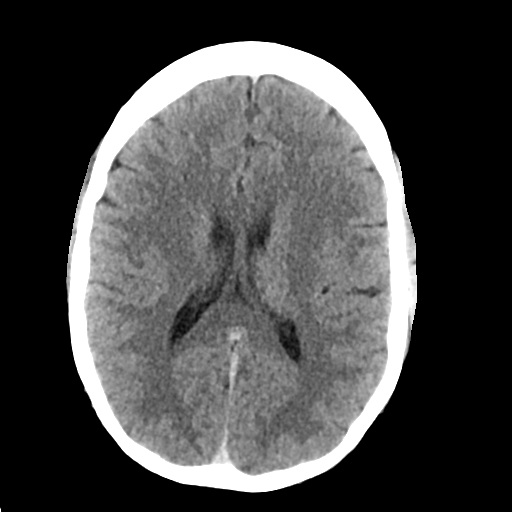
[im 19/31  brain]
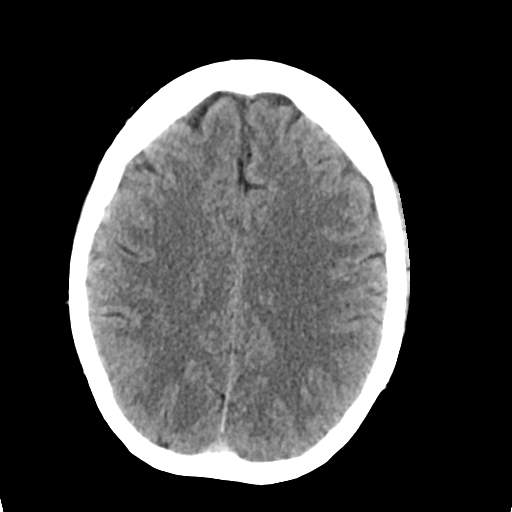
[im 19/31  bone]
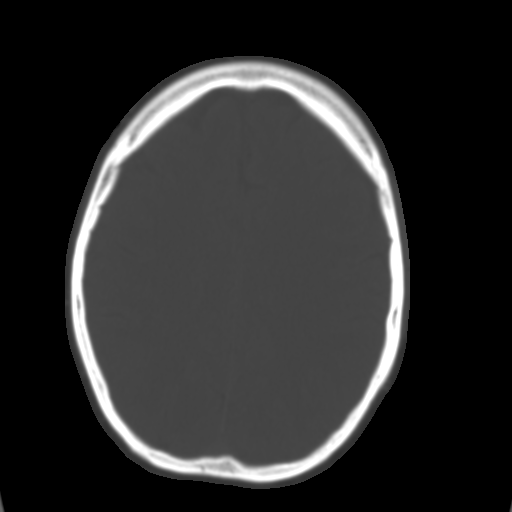
[im 23/31  brain]
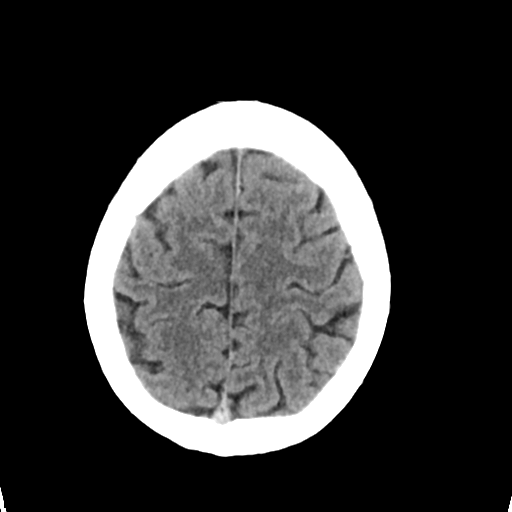
[im 27/31  brain]
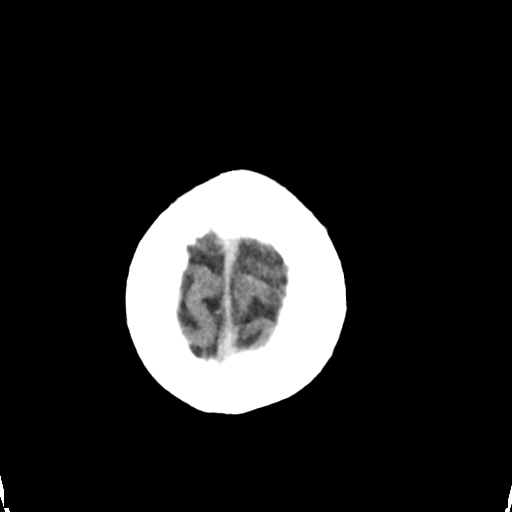

[Series 3: head bone · axial · 0.41mm/px · z∈[-142,-112]mm · 3 of 77 slices shown]
[im 8/77  bone]
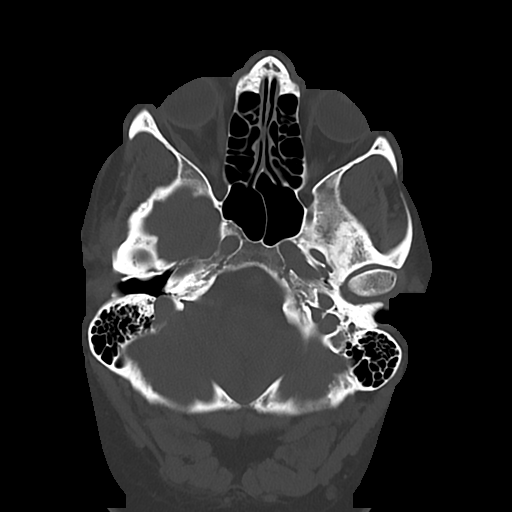
[im 16/77  bone]
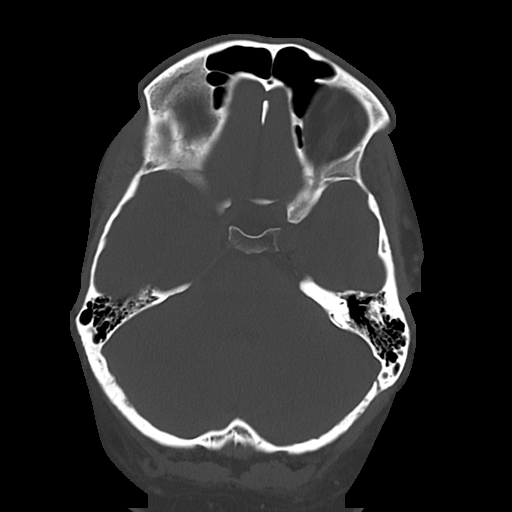
[im 23/77  bone]
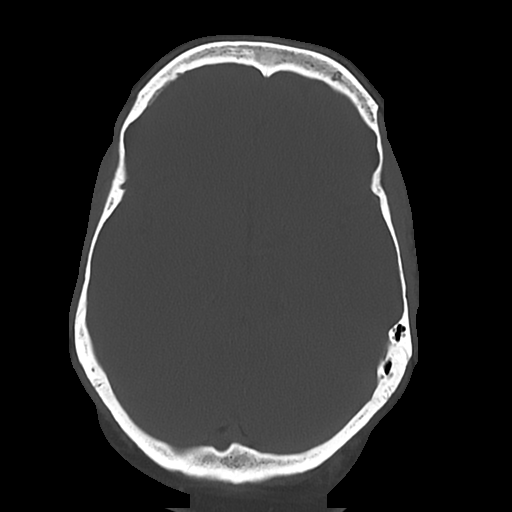

[Series 4: cor soft · coronal · 0.33mm/px · 3 of 69 slices shown]
[im 23/69  brain]
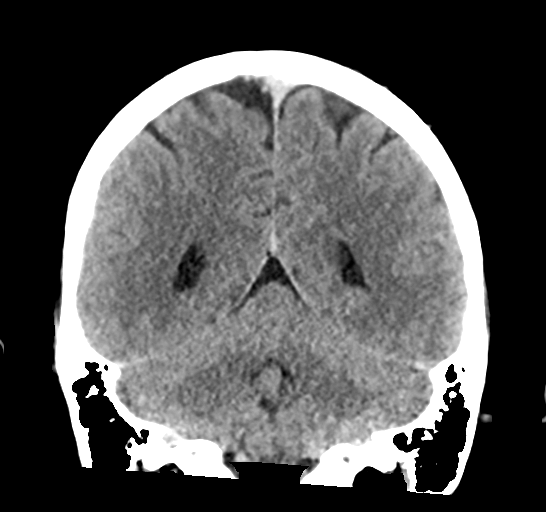
[im 31/69  brain]
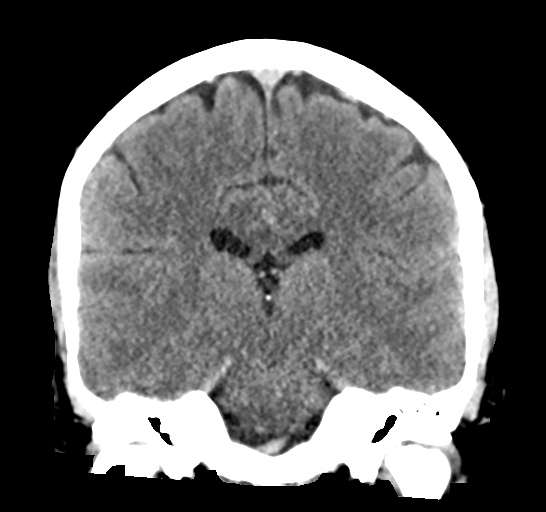
[im 38/69  brain]
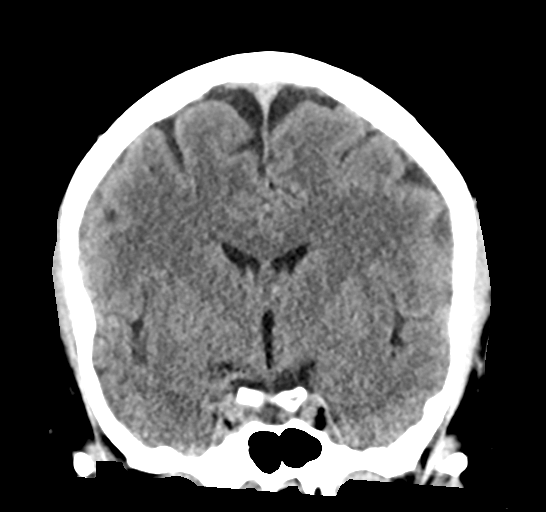

[Series 5: sag soft · sagittal · 0.31mm/px · 3 of 59 slices shown]
[im 20/59  brain]
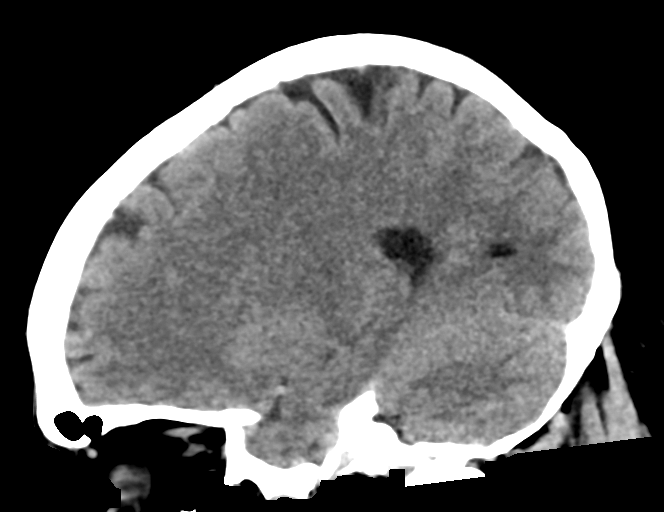
[im 30/59  brain]
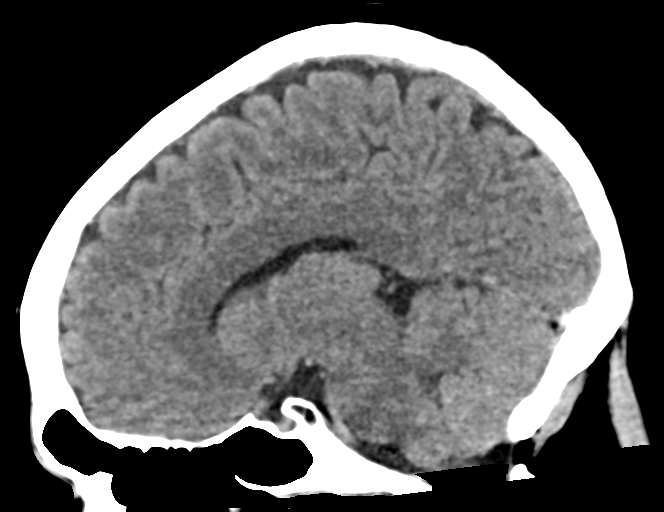
[im 39/59  brain]
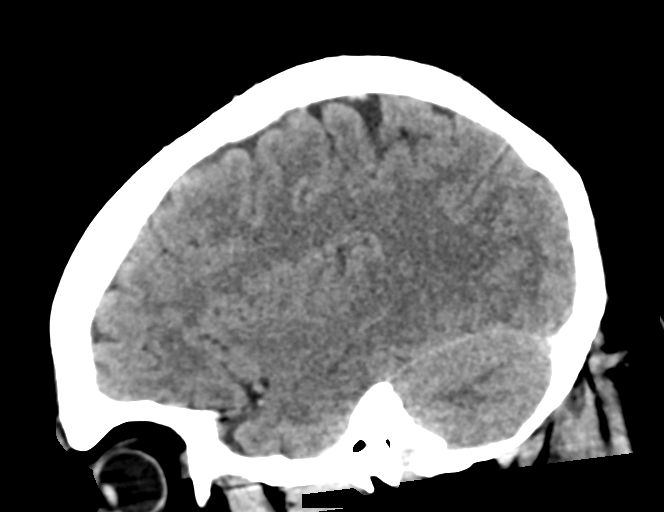

[16 of 47 positions shown; findings below may reference images not displayed]

FINDINGS: Brain: No evidence of acute large vascular territory infarction,
hemorrhage, hydrocephalus, extra-axial collection or mass
lesion/mass effect.

Vascular: No hyperdense vessel identified.

Skull: No acute fracture.

Sinuses/Orbits: Clear sinuses.  Acute orbital findings

Other: No mastoid effusions.
IMPRESSION: No evidence of acute intracranial abnormality.

## 2022-04-30 IMAGING — MR MR HEAD W/O CM
12 series · 45 of 48 positions shown · non-contrast
Comparison: Head CT [DATE].

CLINICAL DATA: Provided history: Neuro deficit, acute, stroke
suspected. Right facial droop.

EXAM:
MRI HEAD WITHOUT CONTRAST
TECHNIQUE: Multiplanar, multiecho pulse sequences of the brain and surrounding
structures were obtained without intravenous contrast.

[Series 5: ax dwi_tracew · axial · 3.0mm · 0.65mm/px · z∈[-98,+56]mm · 4 of 48 slices shown]
[im 1/48]
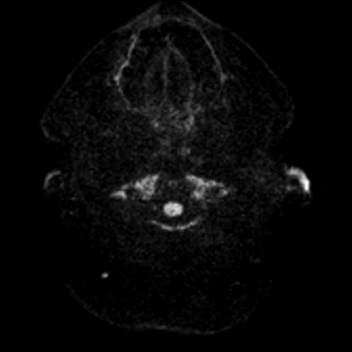
[im 16/48]
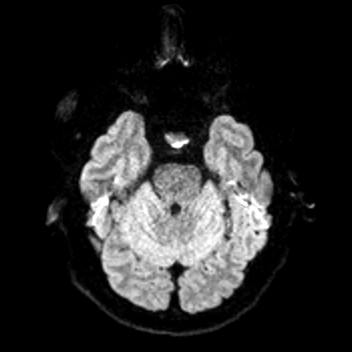
[im 32/48]
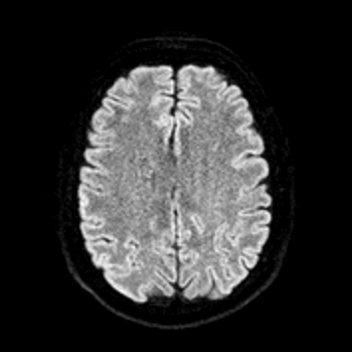
[im 48/48]
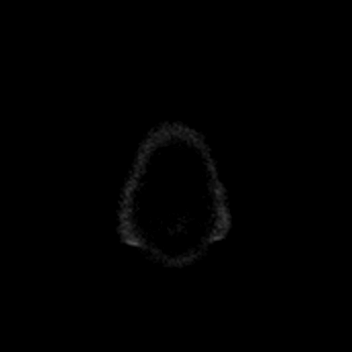

[Series 6: ax dwi_adc · axial · 3.0mm · 0.65mm/px · z∈[-98,+56]mm · 3 of 48 slices shown]
[im 1/48]
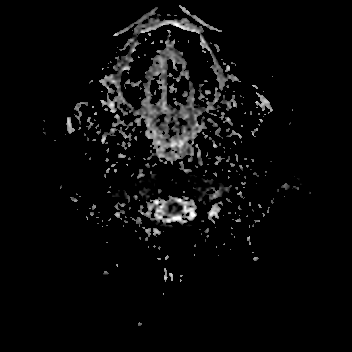
[im 24/48]
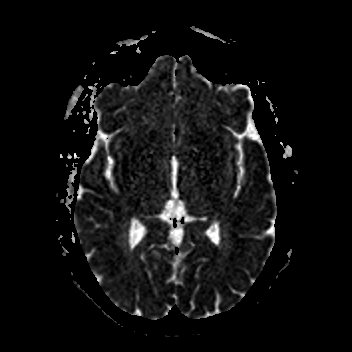
[im 48/48]
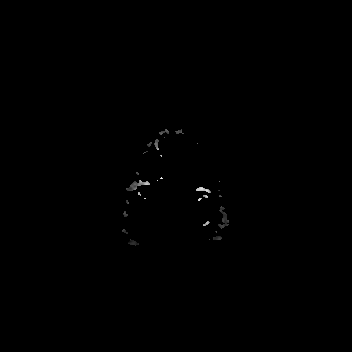

[Series 7: cor dwi_tracew · coronal · 5.0mm · 0.65mm/px · 3 of 38 slices shown]
[im 1/38]
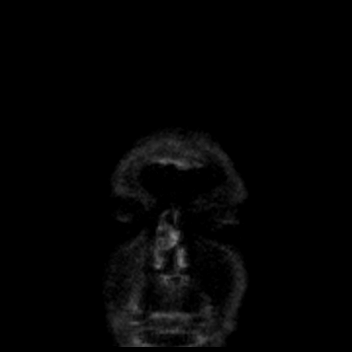
[im 19/38]
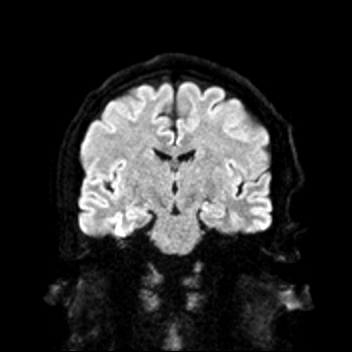
[im 38/38]
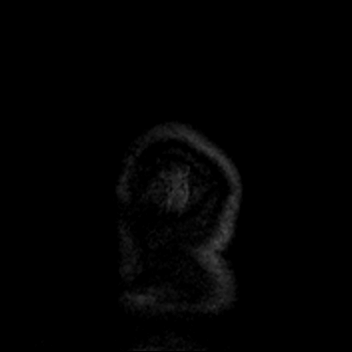

[Series 8: cor dwi_adc · coronal · 5.0mm · 0.65mm/px · 3 of 38 slices shown]
[im 1/38]
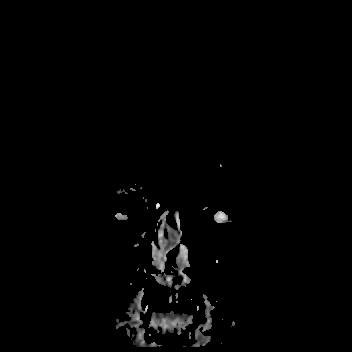
[im 19/38]
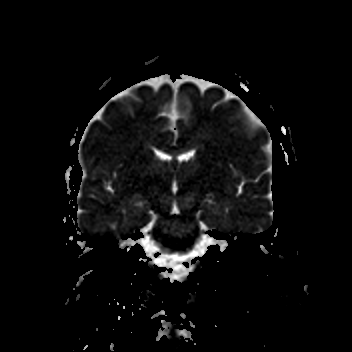
[im 38/38]
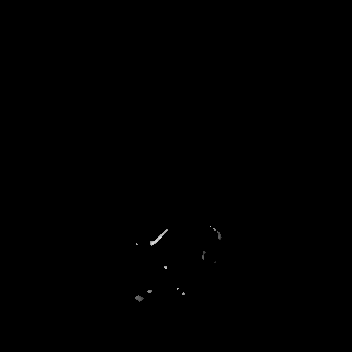

[Series 9: T1 · sagittal · 5.0mm · 0.62mm/px · 2 of 25 slices shown (1 of 2)]
[im 1/25]
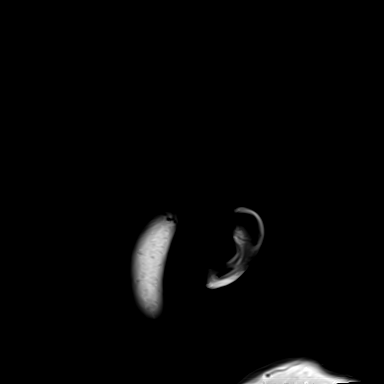
[im 25/25]
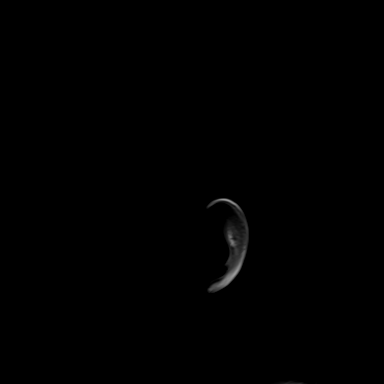

[Series 10: T2 · axial · 5.0mm · 0.53mm/px · z∈[-107,+66]mm · 2 of 30 slices shown (1 of 2)]
[im 1/30]
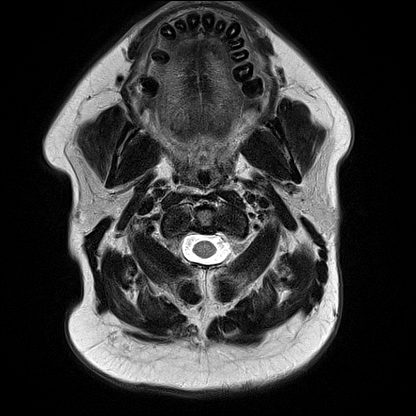
[im 30/30]
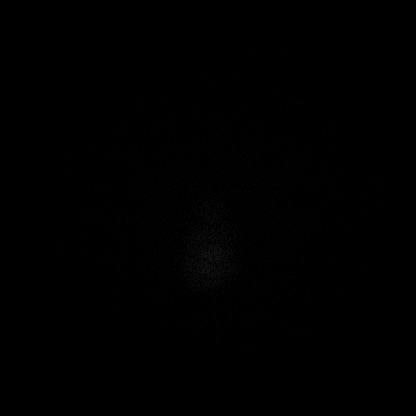

[Series 11: mag_images · axial · 3.0mm · 0.90mm/px · z∈[-109,+68]mm · 4 of 60 slices shown]
[im 1/60]
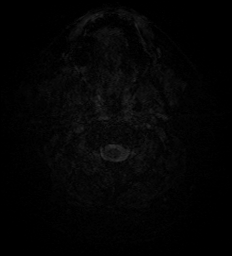
[im 20/60]
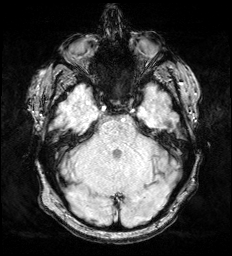
[im 40/60]
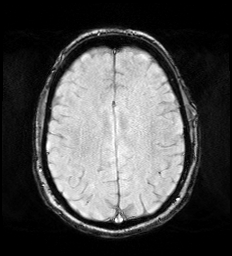
[im 60/60]
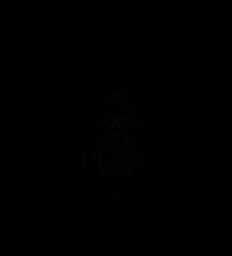

[Series 12: pha_images · axial · 3.0mm · 0.90mm/px · z∈[-109,+68]mm · 4 of 59 slices shown]
[im 1/59]
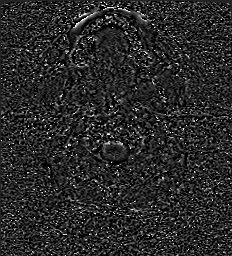
[im 20/59]
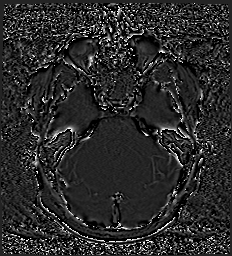
[im 39/59]
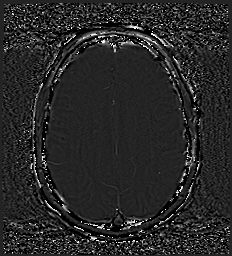
[im 59/59]
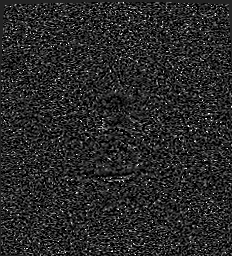

[Series 13: swi_images · axial · 3.0mm · 0.90mm/px · z∈[-109,+68]mm · 4 of 60 slices shown]
[im 1/60]
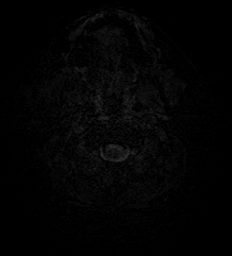
[im 20/60]
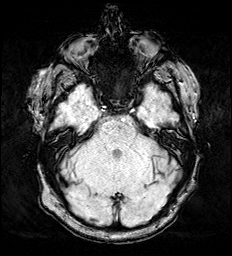
[im 40/60]
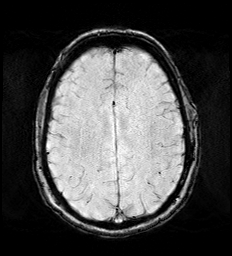
[im 60/60]
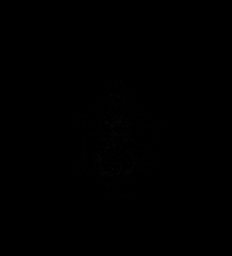

[Series 15: FLAIR · axial · 3.0mm · 0.53mm/px · z∈[-101,+60]mm · 4 of 55 slices shown]
[im 1/55]
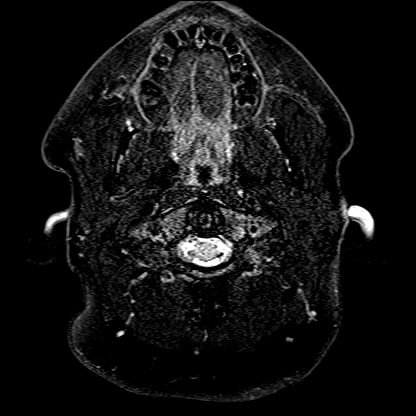
[im 19/55]
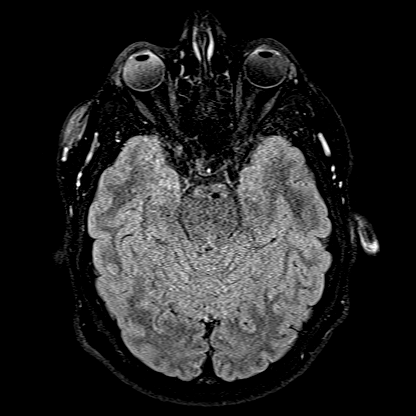
[im 37/55]
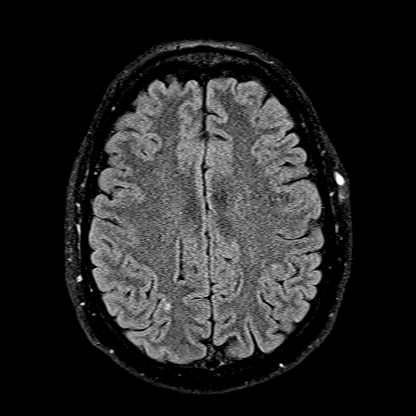
[im 55/55]
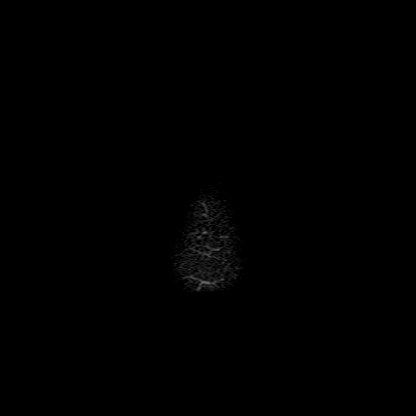

[Series 16: T1 · axial · 1.0mm · 0.98mm/px · z∈[-109,+66]mm · 10 of 175 slices shown (2 of 2)]
[im 1/175]
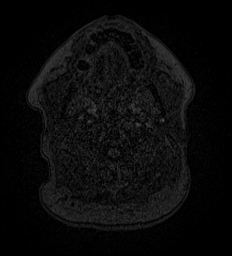
[im 15/175]
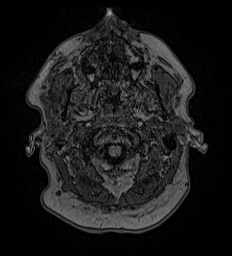
[im 30/175]
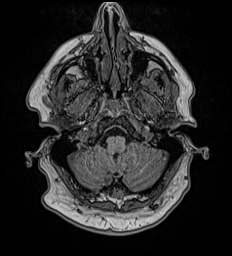
[im 44/175]
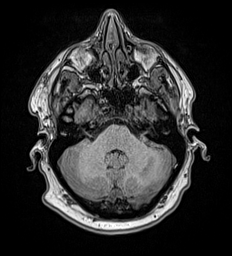
[im 59/175]
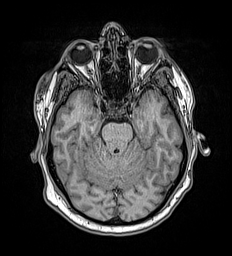
[im 73/175]
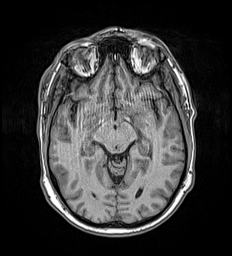
[im 102/175]
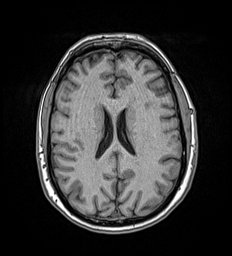
[im 117/175]
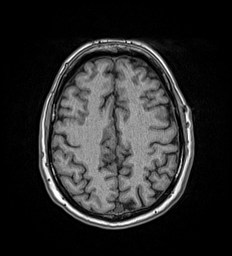
[im 146/175]
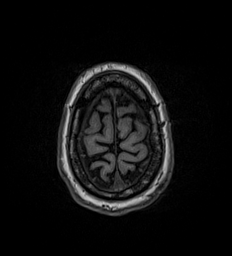
[im 175/175]
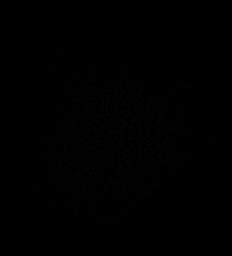

[Series 17: T2 · coronal · 5.0mm · 0.57mm/px · 2 of 29 slices shown (2 of 2)]
[im 1/29]
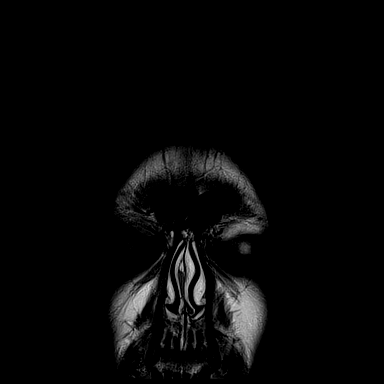
[im 29/29]
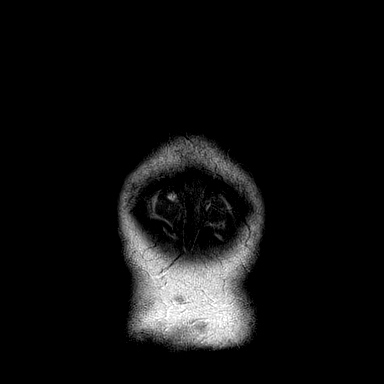

[45 of 48 positions shown; findings below may reference images not displayed]

FINDINGS: Brain:

Cerebral volume is normal.

There is an ill-defined focus of T2 FLAIR hyperintense signal
abnormality within the left internal capsule, extending inferiorly
into the left cerebral peduncle (best appreciated on series 15,
images 22-25). There is no corresponding restricted diffusion at
this site. No appreciable mass effect.

No focal parenchymal signal abnormality is identified elsewhere
within the brain.

There is no acute infarct.

No chronic intracranial blood products.

No extra-axial fluid collection.

No midline shift.

Vascular: Maintained flow voids within the proximal large arterial
vessels.

Skull and upper cervical spine: No focal suspicious marrow lesion.

Sinuses/Orbits: No mass or acute finding within the imaged orbits.
Minimal mucosal thickening within the bilateral ethmoid and
maxillary sinuses.

Other: Nonspecific 2.7 cm focus of soft tissue induration, swelling
and edema within the right face.
IMPRESSION: Ill-defined focus of T2 FLAIR hyperintense signal abnormality within
the left internal capsule, extending inferiorly into the left
cerebral peduncle. While this may reflect a remote insult,
post-contrast MR imaging of the brain is recommended to exclude
active inflammation at this site. If this focus does not demonstrate
corresponding pathologic enhancement, a subsequent short-interval (3
month) follow-up MRI would still be recommended to ensure stability.

Otherwise unremarkable non-contrast MRI appearance of the brain.

Minimal mucosal thickening within the bilateral ethmoid and
maxillary sinuses.

Nonspecific 2.7 cm focus of right facial soft tissue induration,
swelling and edema. Direct visualization recommended.

## 2022-04-30 IMAGING — MR MR HEAD W/ CM
4 series · 48 of 48 positions shown · IV contrast (10ml Gadavist)
Comparison: Same day MRI and CT head.

CLINICAL DATA: rt facial droop, abnormal MR wo contrast

EXAM:
MRI HEAD WITH CONTRAST
TECHNIQUE: Multiplanar, multiecho pulse sequences of the brain and surrounding
structures were obtained with intravenous contrast.
CONTRAST:  10mL GADAVIST GADOBUTROL 1 MMOL/ML IV SOLN

[Series 5: T1 · axial · 1.0mm · 0.98mm/px · z∈[-121,+49]mm · 21 of 175 slices shown]
[im 1/175]
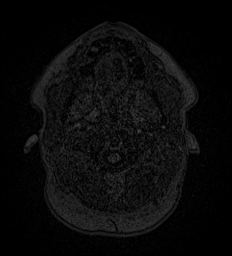
[im 9/175]
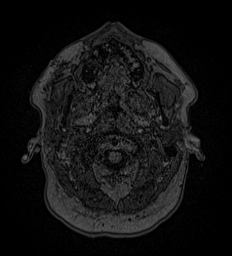
[im 18/175]
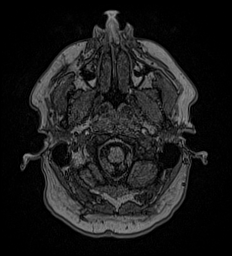
[im 27/175]
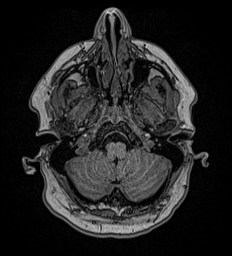
[im 35/175]
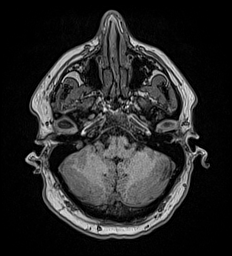
[im 44/175]
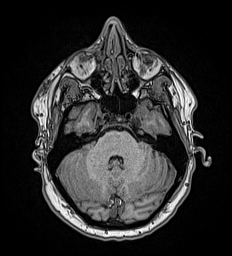
[im 53/175]
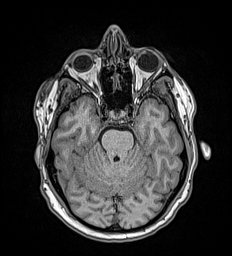
[im 61/175]
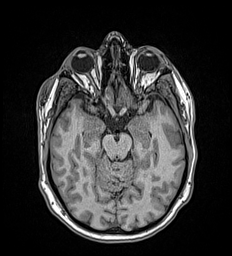
[im 70/175]
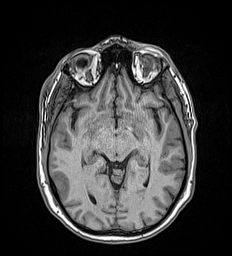
[im 79/175]
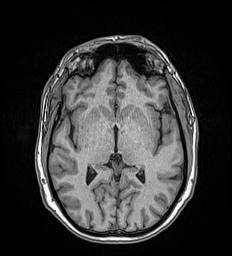
[im 88/175]
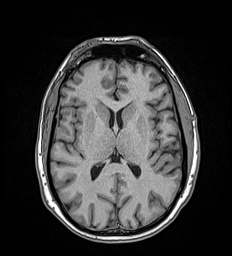
[im 96/175]
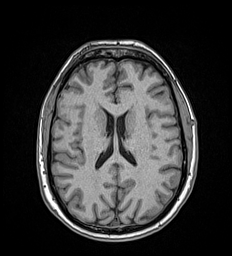
[im 105/175]
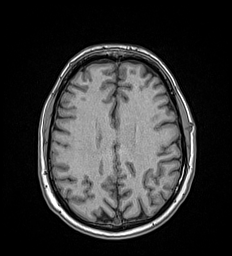
[im 114/175]
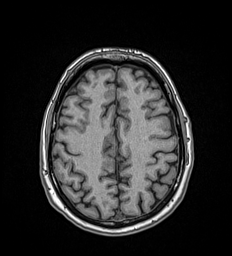
[im 122/175]
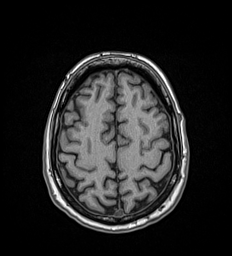
[im 131/175]
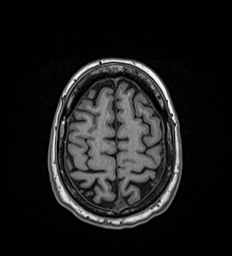
[im 140/175]
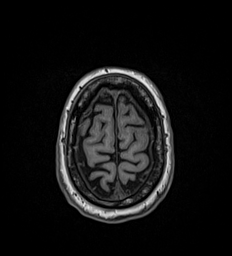
[im 148/175]
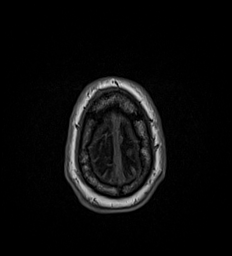
[im 157/175]
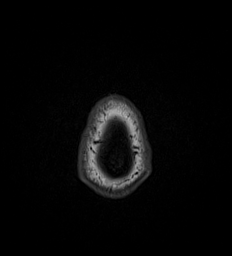
[im 166/175]
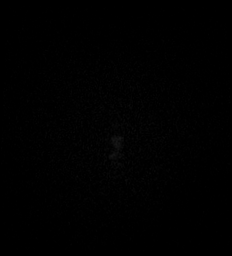
[im 175/175]
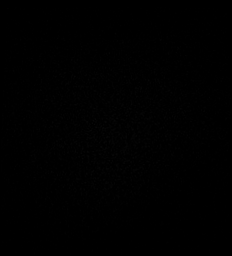

[Series 6: T1 post-contrast · axial · 1.0mm · 0.98mm/px · z∈[-121,+48]mm · 21 of 174 slices shown (1 of 3)]
[im 1/174]
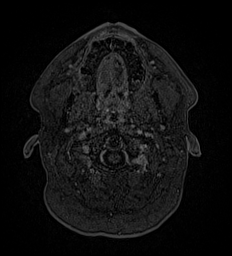
[im 9/174]
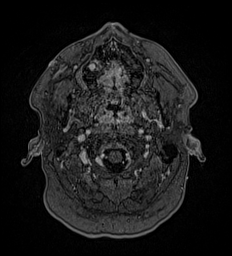
[im 18/174]
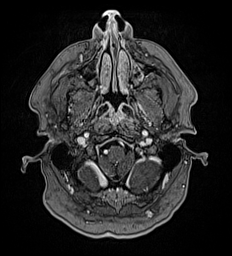
[im 26/174]
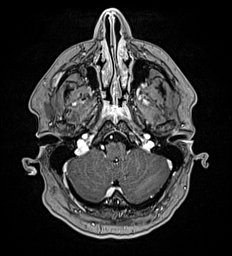
[im 35/174]
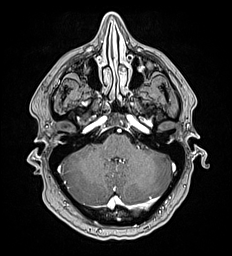
[im 44/174]
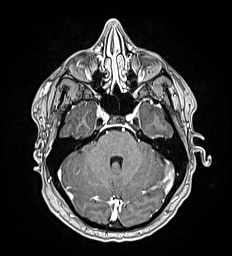
[im 52/174]
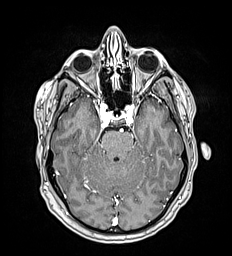
[im 61/174]
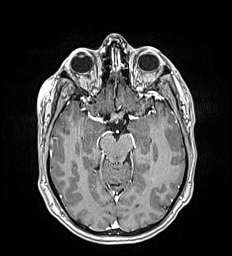
[im 70/174]
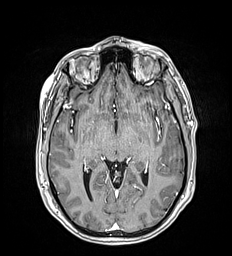
[im 78/174]
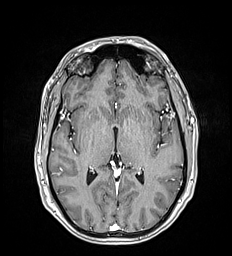
[im 87/174]
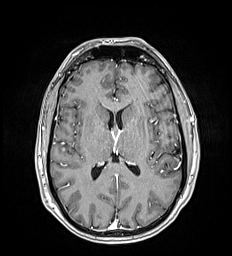
[im 96/174]
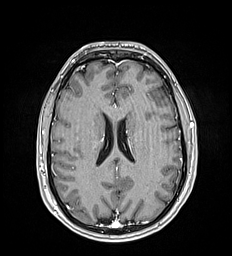
[im 104/174]
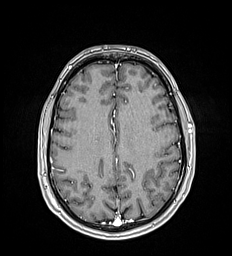
[im 113/174]
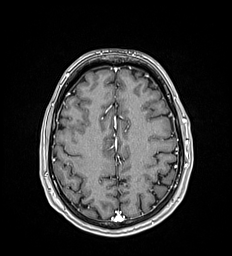
[im 122/174]
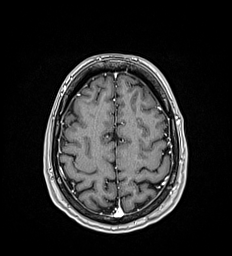
[im 130/174]
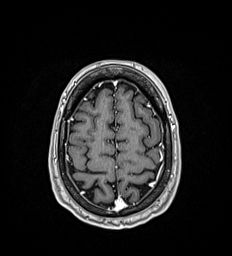
[im 139/174]
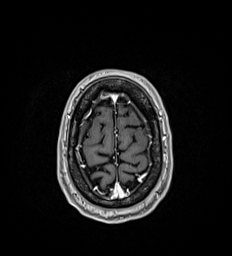
[im 148/174]
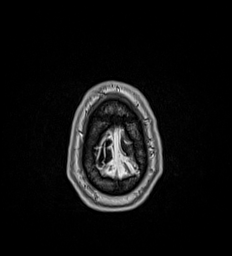
[im 156/174]
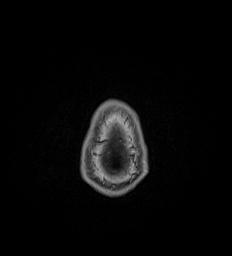
[im 165/174]
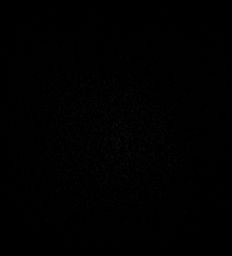
[im 174/174]
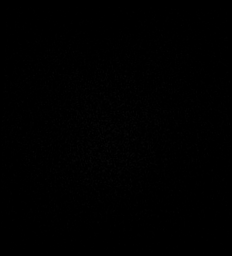

[Series 7: T1 post-contrast · coronal · 5.0mm · 0.57mm/px · 3 of 29 slices shown (2 of 3)]
[im 1/29]
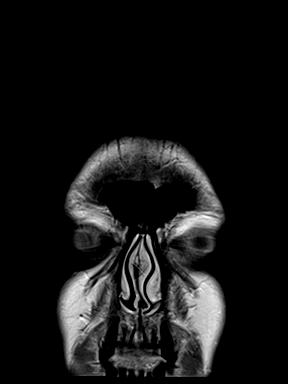
[im 15/29]
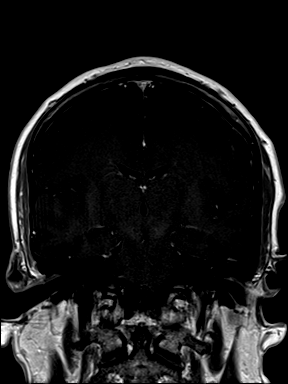
[im 29/29]
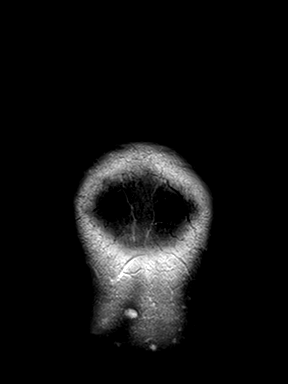

[Series 8: T1 post-contrast · sagittal · 5.0mm · 0.62mm/px · 3 of 23 slices shown (3 of 3)]
[im 1/23]
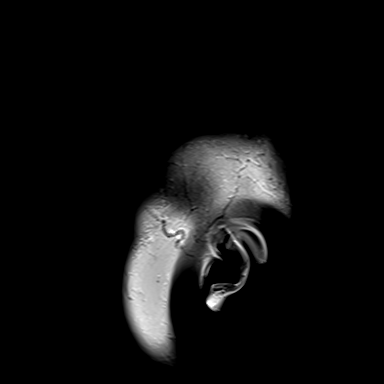
[im 12/23]
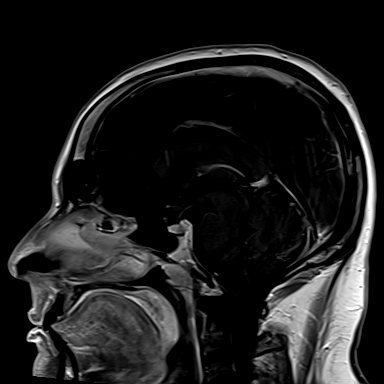
[im 23/23]
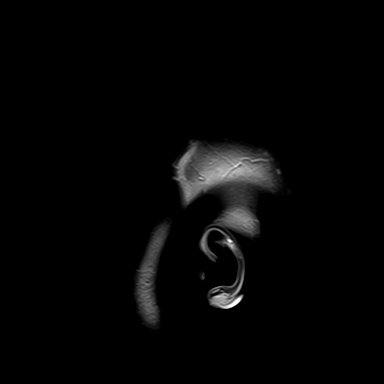

[48 of 48 positions shown; findings below may reference images not displayed]

FINDINGS: Postcontrast imaging was performed to further evaluate findings on
same day noncontrast MRI. No evidence of pathologic enhancement.
IMPRESSION: No pathologic enhancement.

## 2022-04-30 MED ORDER — SODIUM CHLORIDE 0.9% FLUSH: 3.0000 mL | Freq: Once | INTRAVENOUS | Status: DC

## 2022-04-30 MED ORDER — VALACYCLOVIR HCL 1 G PO TABS: 1000.0000 mg | ORAL_TABLET | Freq: Three times a day (TID) | ORAL | 0 refills | Status: AC

## 2022-04-30 MED ORDER — PREDNISONE 20 MG PO TABS: 40.0000 mg | ORAL_TABLET | Freq: Every day | ORAL | 0 refills | Status: AC

## 2022-04-30 MED ORDER — GADOBUTROL 1 MMOL/ML IV SOLN
10.0000 mL | Freq: Once | INTRAVENOUS | Status: AC | PRN
  Administered 2022-04-30: 10 mL via INTRAVENOUS

## 2022-04-30 NOTE — ED Notes (Signed)
Patient completed MRI screening at this time. ?

## 2022-04-30 NOTE — ED Provider Notes (Addendum)
? ?[facility] ?Provider Note ? ? ? Event Date/Time  ? First MD Initiated Contact with Patient 04/30/22 1202   ?  (approximate) ? ?History  ? ?Chief Complaint: Facial Droop ? ?HPI ? ?Ryan Waters is a 41 y.o. male with no significant past medical history presents to the emergency department for right facial droop.  According to the patient on Friday (4 days ago) patient noticed drooping to the right side of his face as well as a numbness sensation.  Patient denies any weakness or numbness of any arm or leg.  He has noted increased tearing and irritation of his right eye over the past several days as well.  No other complaints. ? ?Physical Exam  ? ?Triage Vital Signs: ?ED Triage Vitals [04/30/22 1032]  ?Enc Vitals Group  ?   BP (!) 148/106  ?   Pulse Rate (!) 104  ?   Resp 18  ?   Temp 98.6 ?F (37 ?C)  ?   Temp Source Oral  ?   SpO2 95 %  ?   Weight 250 lb (113.4 kg)  ?   Height '5\' 7"'$  (1.702 m)  ?   Head Circumference   ?   Peak Flow   ?   Pain Score 0  ?   Pain Loc   ?   Pain Edu?   ?   Excl. in [place]?   ? ? ?Most recent vital signs: ?Vitals:  ? 04/30/22 1032  ?BP: (!) 148/106  ?Pulse: (!) 104  ?Resp: 18  ?Temp: 98.6 ?F (37 ?C)  ?SpO2: 95%  ? ? ?General: Awake, no distress.  ?CV:  Good peripheral perfusion.  Regular rate and rhythm  ?Resp:  Normal effort.  Equal breath sounds bilaterally.  ?Abd:  No distention.  Soft, nontender.  No rebound or guarding. ? ? ? ?ED Results / Procedures / Treatments  ? ?RADIOLOGY ? ?I personally viewed the CT images no significant abnormality seen on my evaluation. ?Radiology is read the CT is negative. ? ? ?MEDICATIONS ORDERED IN ED: ?Medications  ?sodium chloride flush (NS) 0.9 % injection 3 mL (has no administration in time range)  ? ? ? ?IMPRESSION / MDM / ASSESSMENT AND PLAN / ED COURSE  ?I reviewed the triage vital signs and the nursing notes. ? ?Patient presents emergency department for right facial droop noted 4 days ago.  Patient also states a numbness  sensation to the right side as well.  Overall the patient appears well, neurologic exam in the extremities is intact.  Patient does have a right facial droop but states sensation is intact and equal on my evaluation.  Does have some mild erythema and tearing of the right eye.  Symptoms are very suggestive of Bell's palsy.  Patient's emergency department work-up has been reassuring including a normal CT scan normal labs.  I did discuss with the patient the only 100% way to rule out a stroke given the complaint of numbness as well would be to proceed with an MR of the brain.  Patient would like to proceed with MR imaging.  Patient CBC is normal, chemistry is reassuring.  If MRI is negative we will treat as Bell's palsy have the patient follow-up with his doctor.  Place patient on prednisone and valacyclovir.  Already discussed with the patient taping his eye shut at night and using a lubricating eyedrop during the day. ? ?Patient's MRI does show a T2 signal flair.  They are recommending postcontrast imaging  as well.  I spoke to the patient regarding this he is agreeable to this plan. ? ?MRI is negative.  We will discharge with prednisone and valacyclovir.  Patient agreeable to plan of care. ? ?FINAL CLINICAL IMPRESSION(S) / ED DIAGNOSES  ? ?Bell's palsy ? ? ?Note:  This document was prepared using Dragon voice recognition software and may include unintentional dictation errors. ?  ?Harvest Dark, MD ?04/30/22 1507 ? ?  ?Harvest Dark, MD ?04/30/22 1557 ? ?

## 2022-04-30 NOTE — ED Triage Notes (Signed)
Pt here with a right sided facial droop since last Wed. Pt denies pain or weakness. Pt has a minor slurred speech. Pt stable in triage. ?

## 2022-06-20 ENCOUNTER — Telehealth: Payer: Self-pay | Admitting: Oncology

## 2022-06-20 NOTE — Telephone Encounter (Signed)
Contacted pt to verify health insurance information and request that he bring in the card for [place] to scan into the system, LVM.  Pt is to be scheduled for a scan this month but unable to verify insurance information with [employer].

## 2022-07-05 ENCOUNTER — Other Ambulatory Visit: Payer: Self-pay | Admitting: Oncology

## 2022-07-08 ENCOUNTER — Encounter: Payer: Self-pay | Admitting: Oncology

## 2022-07-09 ENCOUNTER — Inpatient Hospital Stay: Payer: BC Managed Care – PPO | Admitting: Oncology

## 2022-07-09 ENCOUNTER — Ambulatory Visit: Payer: BC Managed Care – PPO | Admitting: Oncology

## 2022-07-28 NOTE — Progress Notes (Addendum)
[place]  [address] [place],  [place]  [address] [phone]  Clinic Day:  07/29/2022  Referring physician: [facility]  HISTORY OF PRESENT ILLNESS:  The patient is a 41 y.o. 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  This gentleman was placed on Gleevec afterwards, which he took up until June 2022.  Shortly thereafter, he relocated to this area.  He comes in again today to go over his CT scans to ensure there remains no radiographic evidence of disease recurrence.  Scans at his last visit did not show this to be the case.  Overall, the patient has been doing well.  He denies having any abdominal pain, nausea, other symptoms which concern him for overt signs of disease recurrence.     PHYSICAL EXAM:  Blood pressure (!) 142/90, pulse 75, temperature 98.3 F (36.8 C), resp. rate 16, height '5\' 6"'$  (1.676 m), weight 270 lb 11.2 oz (122.8 kg), SpO2 93 %. Wt Readings from Last 3 Encounters:  07/29/22 270 lb 11.2 oz (122.8 kg)  04/30/22 250 lb (113.4 kg)  01/07/22 265 lb 6.4 oz (120.4 kg)   Body mass index is 43.69 kg/m. Performance status (ECOG): 0 - Asymptomatic Physical Exam Constitutional:      Appearance: Normal appearance. He is not ill-appearing.  HENT:     Mouth/Throat:     Mouth: Mucous membranes are moist.     Pharynx: Oropharynx is clear. No oropharyngeal exudate or posterior oropharyngeal erythema.  Cardiovascular:     Rate and Rhythm: Normal rate and regular rhythm.     Heart sounds: No murmur heard.    No friction rub. No gallop.  Pulmonary:     Effort: Pulmonary effort is normal. No respiratory distress.     Breath sounds: Normal breath sounds. No wheezing, rhonchi or rales.  Abdominal:     General: Bowel sounds are normal. There is no distension.     Palpations: Abdomen is soft. There is no mass.     Tenderness: There  is no abdominal tenderness.  Musculoskeletal:        General: No swelling.     Right lower leg: No edema.     Left lower leg: No edema.  Lymphadenopathy:     Cervical: No cervical adenopathy.     Upper Body:     Right upper body: No supraclavicular or axillary adenopathy.     Left upper body: No supraclavicular or axillary adenopathy.     Lower Body: No right inguinal adenopathy. No left inguinal adenopathy.  Skin:    General: Skin is warm.     Coloration: Skin is not jaundiced.     Findings: No lesion or rash.     Comments: Multiple flesh-colored nodules and caf au lait spots seen over his body  Neurological:     General: No focal deficit present.     Mental Status: He is alert and oriented to person, place, and time. Mental status is at baseline.  Psychiatric:        Mood and Affect: Mood normal.        Behavior: Behavior normal.        Thought Content: Thought content normal.    SCANS: CT scans done in July 2023 revealed the following: FINDINGS: Lower chest: No acute abnormality.  Hepatobiliary: Liver is normal in size and contour. Stable 8 mm hypodense lesion  in the right hepatic lobe segment 6 which is too small to characterize but most likely a cyst or hemangioma. Gallbladder appears normal. No biliary ductal dilatation.  Pancreas: Unremarkable. No pancreatic ductal dilatation or surrounding inflammatory changes.  Spleen: Normal in size without focal abnormality.  Adrenals/Urinary Tract: Adrenal glands appear normal. A few well-defined hypodense likely cystic lesions identified in the bilateral kidneys including 1.5 cm exophytic cyst in the posterior right kidney which measures 25 Hounsfield unit density and 1.5 cm lesion in the medial lower pole left kidney which measures 16 Hounsfield unit density. No hydronephrosis identified bilaterally. Urinary bladder is incompletely distended and not well evaluated.  Stomach/Bowel: No bowel obstruction, free air or  pneumatosis. No bowel wall edema or thickening identified. Postsurgical changes of bowel in the anterior mid abdomen which appears similar to previous study including adjacent mild fat stranding densities. There are at least 4 rounded enhancing soft tissue nodules visualized which are abutting or arising from loops of small bowel in the mid abdomen which appear stable in size since previous study, when they were not as well visualized due to poor contrast bolus timing. The lesions measure 1.6 cm series 2, image 36, 1 cm series 2, image 49, 0.7 cm series 2, image 57, and 0.6 cm series 2, image 64 (which is better visualized on the coronal and sagittal series).  Vascular/Lymphatic: No significant vascular findings are present. Numerous small mesenteric lymph nodes which are not pathologically enlarged. No enlarged pelvic lymph nodes.  Reproductive: Prostate is unremarkable.  Other: There are least 4 supraumbilical/periumbilical abdominal wall hernias containing fat, largest defect measures 1.9 cm. There is also a small umbilical hernia containing fat. No ascites.  Musculoskeletal: No acute or significant osseous findings.  IMPRESSION: 1. At least 4 enhancing soft tissue nodular lesions which are abutting or arising from loops of small bowel in the mid abdomen as described. May represent serosal implants/metastases or additional GIST. 2. Hypodensities in the kidneys which may represent cysts and/or complex cysts as described. Consider further evaluation with MRI abdomen with contrast. 3. Stable subcentimeter hypodensity in the right hepatic lobe which is too small to characterize but most likely represents a cyst or hemangioma. 4. Multiple ventral abdominal wall hernias containing fat.  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  In clinic today, I went over all of his CT scan images with him, for which he could see there are small peritoneal nodules  that were commented upon which may very reflect early signs of disease recurrence.  However, when reviewing the scans with radiology, these nodules were actually seen on his scans in January 2023, but were not commented upon.  As there has not been any growth of these lesions, they will continue to be followed conservatively for now.  I will repeat scans in another 6 months.  If future scans show any evidence of progression of these peritoneal nodules, one would need to be biopsied to confirm recurrence of his small bowel GIST tumor.  The patient understands all plans discussed today and is in agreement with him.     Maley Venezia Macarthur Critchley, MD

## 2022-07-29 ENCOUNTER — Inpatient Hospital Stay: Payer: BC Managed Care – PPO | Attending: Oncology | Admitting: Oncology

## 2022-07-29 ENCOUNTER — Other Ambulatory Visit: Payer: Self-pay | Admitting: Oncology

## 2022-07-29 VITALS — BP 142/90 | HR 75 | Temp 98.3°F | Resp 16 | Ht 66.0 in | Wt 270.7 lb

## 2022-07-29 DIAGNOSIS — C49A3 Gastrointestinal stromal tumor of small intestine: Secondary | ICD-10-CM

## 2022-08-01 ENCOUNTER — Telehealth: Payer: Self-pay | Admitting: Oncology

## 2022-08-01 NOTE — Telephone Encounter (Signed)
Contacted pt to notify him of appointment changes. Unable to reach via phone, voicemail was left. Did advise him to contact the office back if he was needing to R/S the given appt date and time.    Scheduling Message Entered by Lavera Guise A on 07/29/2022 at  5:25 PM Priority: Routine <No visit type provided>  Department: [facility]  Provider:   Scheduling Notes:  Please reschedule pt in 6 months, with scans to be done a day before her next visit.

## 2022-08-26 ENCOUNTER — Ambulatory Visit: Payer: BC Managed Care – PPO | Admitting: Oncology

## 2022-12-04 DIAGNOSIS — C49A3 Gastrointestinal stromal tumor of small intestine: Secondary | ICD-10-CM | POA: Insufficient documentation

## 2023-01-06 ENCOUNTER — Telehealth: Payer: Self-pay | Admitting: Oncology

## 2023-01-06 NOTE — Telephone Encounter (Signed)
CT A/P has been scheduled for 01/28/23 @ 10 am; Check in @ 9 am    Unable to reach via phone, left msg requesting pt to call me back to provide appt information and instructions.

## 2023-01-28 NOTE — Progress Notes (Incomplete)
[place]  [address] [place],  [place]  [address] [phone]  Clinic Day:  07/29/2022  Referring physician: [facility]  HISTORY OF PRESENT ILLNESS:  The patient is a 42 y.o. 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  This gentleman was placed on Gleevec afterwards, which he took up until June 2022.  Shortly thereafter, he relocated to this area.  He comes in again today to go over his CT scans to ensure there remains no radiographic evidence of disease recurrence.  Scans at his last visit did not show this to be the case.  Overall, the patient has been doing well.  He denies having any abdominal pain, nausea, other symptoms which concern him for overt signs of disease recurrence.     PHYSICAL EXAM:  There were no vitals taken for this visit. Wt Readings from Last 3 Encounters:  07/29/22 270 lb 11.2 oz (122.8 kg)  04/30/22 250 lb (113.4 kg)  01/07/22 265 lb 6.4 oz (120.4 kg)   There is no height or weight on file to calculate BMI. Performance status (ECOG): 0 - Asymptomatic Physical Exam Constitutional:      Appearance: Normal appearance. He is not ill-appearing.  HENT:     Mouth/Throat:     Mouth: Mucous membranes are moist.     Pharynx: Oropharynx is clear. No oropharyngeal exudate or posterior oropharyngeal erythema.  Cardiovascular:     Rate and Rhythm: Normal rate and regular rhythm.     Heart sounds: No murmur heard.    No friction rub. No gallop.  Pulmonary:     Effort: Pulmonary effort is normal. No respiratory distress.     Breath sounds: Normal breath sounds. No wheezing, rhonchi or rales.  Abdominal:     General: Bowel sounds are normal. There is no distension.     Palpations: Abdomen is soft. There is no mass.     Tenderness: There is no abdominal tenderness.  Musculoskeletal:        General: No swelling.     Right  lower leg: No edema.     Left lower leg: No edema.  Lymphadenopathy:     Cervical: No cervical adenopathy.     Upper Body:     Right upper body: No supraclavicular or axillary adenopathy.     Left upper body: No supraclavicular or axillary adenopathy.     Lower Body: No right inguinal adenopathy. No left inguinal adenopathy.  Skin:    General: Skin is warm.     Coloration: Skin is not jaundiced.     Findings: No lesion or rash.     Comments: Multiple flesh-colored nodules and caf au lait spots seen over his body  Neurological:     General: No focal deficit present.     Mental Status: He is alert and oriented to person, place, and time. Mental status is at baseline.  Psychiatric:        Mood and Affect: Mood normal.        Behavior: Behavior normal.        Thought Content: Thought content normal.   SCANS: CT scans done in July 2023 revealed the following:   ASSESSMENT & PLAN:  A 42 y.o. male with a history small bowel GIST tumor.  In clinic today, I went over all of his CT scan images with him, for which he could see  there are small peritoneal nodules that were commented upon which may very reflect early signs of disease recurrence.  However, when reviewing the scans with radiology, these nodules were actually seen on his scans in January 2023, but were not commented upon.  As there has not been any growth of these lesions, they will continue to be followed conservatively for now.  I will repeat scans in another 6 months.  If future scans show any evidence of progression of these peritoneal nodules, one would need to be biopsied to confirm recurrence of his small bowel GIST tumor.  The patient understands all plans discussed today and is in agreement with him.     Konstantina Nachreiner Macarthur Critchley, MD

## 2023-01-29 ENCOUNTER — Inpatient Hospital Stay: Payer: BC Managed Care – PPO | Admitting: Oncology

## 2023-01-30 NOTE — Progress Notes (Incomplete)
[place]  [address] [place],  [place]  [address] [phone]  Clinic Day:  07/29/2022  Referring physician: [facility]  HISTORY OF PRESENT ILLNESS:  The patient is a 42 y.o. 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  This gentleman was placed on Gleevec afterwards, which he took up until June 2022.  Shortly thereafter, he relocated to this area.  He comes in again today to go over his CT scans to ensure there remains no radiographic evidence of disease recurrence.  Scans at his last visit did not show this to be the case.  Overall, the patient has been doing well.  He denies having any abdominal pain, nausea, other symptoms which concern him for overt signs of disease recurrence.     PHYSICAL EXAM:  There were no vitals taken for this visit. Wt Readings from Last 3 Encounters:  07/29/22 270 lb 11.2 oz (122.8 kg)  04/30/22 250 lb (113.4 kg)  01/07/22 265 lb 6.4 oz (120.4 kg)   There is no height or weight on file to calculate BMI. Performance status (ECOG): 0 - Asymptomatic Physical Exam Constitutional:      Appearance: Normal appearance. He is not ill-appearing.  HENT:     Mouth/Throat:     Mouth: Mucous membranes are moist.     Pharynx: Oropharynx is clear. No oropharyngeal exudate or posterior oropharyngeal erythema.  Cardiovascular:     Rate and Rhythm: Normal rate and regular rhythm.     Heart sounds: No murmur heard.    No friction rub. No gallop.  Pulmonary:     Effort: Pulmonary effort is normal. No respiratory distress.     Breath sounds: Normal breath sounds. No wheezing, rhonchi or rales.  Abdominal:     General: Bowel sounds are normal. There is no distension.     Palpations: Abdomen is soft. There is no mass.     Tenderness: There is no abdominal tenderness.  Musculoskeletal:        General: No swelling.     Right  lower leg: No edema.     Left lower leg: No edema.  Lymphadenopathy:     Cervical: No cervical adenopathy.     Upper Body:     Right upper body: No supraclavicular or axillary adenopathy.     Left upper body: No supraclavicular or axillary adenopathy.     Lower Body: No right inguinal adenopathy. No left inguinal adenopathy.  Skin:    General: Skin is warm.     Coloration: Skin is not jaundiced.     Findings: No lesion or rash.     Comments: Multiple flesh-colored nodules and caf au lait spots seen over his body  Neurological:     General: No focal deficit present.     Mental Status: He is alert and oriented to person, place, and time. Mental status is at baseline.  Psychiatric:        Mood and Affect: Mood normal.        Behavior: Behavior normal.        Thought Content: Thought content normal.   SCANS: CT scans done in July 2023 revealed the following:   ASSESSMENT & PLAN:  A 42 y.o. male with a history small bowel GIST tumor.  In clinic today, I went over all of his CT scan images with him, for which he could see  there are small peritoneal nodules that were commented upon which may very reflect early signs of disease recurrence.  However, when reviewing the scans with radiology, these nodules were actually seen on his scans in January 2023, but were not commented upon.  As there has not been any growth of these lesions, they will continue to be followed conservatively for now.  I will repeat scans in another 6 months.  If future scans show any evidence of progression of these peritoneal nodules, one would need to be biopsied to confirm recurrence of his small bowel GIST tumor.  The patient understands all plans discussed today and is in agreement with him.     Kimyah Frein Macarthur Critchley, MD

## 2023-01-31 ENCOUNTER — Inpatient Hospital Stay: Payer: BC Managed Care – PPO | Admitting: Oncology

## 2023-02-11 LAB — CBC: RBC: 5.32 — AB (ref 3.87–5.11)

## 2023-02-11 LAB — BASIC METABOLIC PANEL
BUN: 16 (ref 4–21)
CO2: 23 — AB (ref 13–22)
Chloride: 105 (ref 99–108)
Creatinine: 1.1 (ref 0.6–1.3)
Glucose: 99
Potassium: 3.8 mEq/L (ref 3.5–5.1)
Sodium: 138 (ref 137–147)

## 2023-02-11 LAB — HEPATIC FUNCTION PANEL
ALT: 19 U/L (ref 10–40)
AST: 22 (ref 14–40)
Alkaline Phosphatase: 54 (ref 25–125)
Bilirubin, Total: 1.1

## 2023-02-11 LAB — CBC AND DIFFERENTIAL
HCT: 47 (ref 41–53)
Hemoglobin: 16 (ref 13.5–17.5)
Neutrophils Absolute: 4.19
Platelets: 184 K/uL (ref 150–400)
WBC: 7.1

## 2023-02-11 LAB — COMPREHENSIVE METABOLIC PANEL
Albumin: 4.6 (ref 3.5–5.0)
Calcium: 8.8 (ref 8.7–10.7)

## 2023-02-13 NOTE — Progress Notes (Signed)
[facility]  [address] [place],  [place]  [address] [phone]  Clinic Day:  02/14/2023  Referring physician: [facility]  HISTORY OF PRESENT ILLNESS:  The patient is a 42 y.o. 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  This gentleman was placed on Gleevec afterwards, which he took up until June 2022.  Shortly thereafter, he relocated to this area.  He comes in again today to go over his CT scans.  Of note, his last few scans have shown stable peritoneal nodules which have not grown in size.   He denies having nausea, indigestion, or abdominal pain that concerns him for his disease getting worse.  PHYSICAL EXAM:  Blood pressure (!) 152/96, pulse 77, temperature 97.6 F (36.4 C), resp. rate 18, height '5\' 6"'$  (1.676 m), weight 269 lb 12.8 oz (122.4 kg), SpO2 97 %. Wt Readings from Last 3 Encounters:  02/14/23 269 lb 12.8 oz (122.4 kg)  07/29/22 270 lb 11.2 oz (122.8 kg)  04/30/22 250 lb (113.4 kg)   Body mass index is 43.55 kg/m. Performance status (ECOG): 0 - Asymptomatic Physical Exam Constitutional:      Appearance: Normal appearance. He is not ill-appearing.  HENT:     Mouth/Throat:     Mouth: Mucous membranes are moist.     Pharynx: Oropharynx is clear. No oropharyngeal exudate or posterior oropharyngeal erythema.  Cardiovascular:     Rate and Rhythm: Normal rate and regular rhythm.     Heart sounds: No murmur heard.    No friction rub. No gallop.  Pulmonary:     Effort: Pulmonary effort is normal. No respiratory distress.     Breath sounds: Normal breath sounds. No wheezing, rhonchi or rales.  Abdominal:     General: Bowel sounds are normal. There is no distension.     Palpations: Abdomen is soft. There is no mass.     Tenderness: There is no abdominal tenderness.  Musculoskeletal:        General: No swelling.     Right  lower leg: No edema.     Left lower leg: No edema.  Lymphadenopathy:     Cervical: No cervical adenopathy.     Upper Body:     Right upper body: No supraclavicular or axillary adenopathy.     Left upper body: No supraclavicular or axillary adenopathy.     Lower Body: No right inguinal adenopathy. No left inguinal adenopathy.  Skin:    General: Skin is warm.     Coloration: Skin is not jaundiced.     Findings: No lesion or rash.     Comments: Multiple flesh-colored nodules and caf au lait spots seen over his body  Neurological:     General: No focal deficit present.     Mental Status: He is alert and oriented to person, place, and time. Mental status is at baseline.  Psychiatric:        Mood and Affect: Mood normal.        Behavior: Behavior normal.        Thought Content: Thought content normal.    SCANS: CT scans done earlier this week revealed the following: FINDINGS: Lower Chest: No acute findings.  Hepatobiliary: Sub-centimeter low-attenuation lesion in the inferior right hepatic lobe remains stable, consistent with benign etiology. No new or enlarging liver lesions identified. Gallbladder is unremarkable. No evidence of biliary ductal  dilatation.  Pancreas: No mass or inflammatory changes.  Spleen: Within normal limits in size and appearance.  Adrenals/Urinary Tract: No suspicious renal masses identified. Several small bilateral renal cysts remains stable (no followup imaging is recommended) no evidence of ureteral calculi or hydronephrosis.  Stomach/Bowel: No evidence of obstruction, inflammatory process or abnormal fluid collections. Several small soft tissue nodules are seen within small bowel mesentery and left abdominal omental fat. Largest measures 1.6 cm on image 35/2. These remains stable. No evidence of ascites.  Vascular/Lymphatic: No pathologically enlarged lymph nodes. No acute vascular findings.  Reproductive: No mass or other significant  abnormality.  Other: Small epigastric ventral hernias are again seen, which contain only fat.  Musculoskeletal: No suspicious bone lesions identified.  IMPRESSION: Stable small soft tissue nodules within small bowel mesentery and left abdominal omental fat, suspicious for peritoneal metastatic disease.  Stable sub-centimeter low-attenuation lesion right hepatic lobe, consistent with benign etiology. Recommend continued attention on follow-up imaging.  No new or progressive disease within the abdomen or pelvis.  Stable small epigastric ventral hernias, which contain only fat.  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  In clinic today, I went over all of his CT scan images with him, for which he could see the small peritoneal nodules that have not changed in size.   These nodules have not grown since his last scans 7+ months ago.  Despite having metastatic disease, his peritoneal deposits are stable, without him being acutely symptomatic from them.  Based upon this, his disease will continue to be followed conservatively, without any oral therapy being given at this time.  I will see him back in 6 months, with repeat scans being done at that time to ensure there are no signs of disease progression.  The patient understands all plans discussed today and is in agreement with him.    Varsha Knock Macarthur Critchley, MD

## 2023-02-14 ENCOUNTER — Inpatient Hospital Stay: Payer: BC Managed Care – PPO | Attending: Oncology | Admitting: Oncology

## 2023-02-14 ENCOUNTER — Other Ambulatory Visit: Payer: Self-pay | Admitting: Oncology

## 2023-02-14 VITALS — BP 152/96 | HR 77 | Temp 97.6°F | Resp 18 | Ht 66.0 in | Wt 269.8 lb

## 2023-02-14 DIAGNOSIS — C49A3 Gastrointestinal stromal tumor of small intestine: Secondary | ICD-10-CM

## 2023-02-23 ENCOUNTER — Other Ambulatory Visit: Payer: Self-pay

## 2023-02-23 ENCOUNTER — Encounter (HOSPITAL_COMMUNITY): Payer: Self-pay

## 2023-02-23 ENCOUNTER — Observation Stay (HOSPITAL_COMMUNITY)
Admission: EM | Admit: 2023-02-23 | Discharge: 2023-02-24 | Disposition: A | Discharge status: Home / Self Care | Payer: BC Managed Care – PPO | Attending: Family Medicine | Admitting: Family Medicine

## 2023-02-23 ENCOUNTER — Emergency Department (HOSPITAL_COMMUNITY): Payer: BC Managed Care – PPO

## 2023-02-23 DIAGNOSIS — Z1152 Encounter for screening for COVID-19: Secondary | ICD-10-CM | POA: Insufficient documentation

## 2023-02-23 DIAGNOSIS — Z79899 Other long term (current) drug therapy: Secondary | ICD-10-CM | POA: Insufficient documentation

## 2023-02-23 DIAGNOSIS — I10 Essential (primary) hypertension: Secondary | ICD-10-CM | POA: Insufficient documentation

## 2023-02-23 DIAGNOSIS — E876 Hypokalemia: Secondary | ICD-10-CM | POA: Diagnosis not present

## 2023-02-23 DIAGNOSIS — C49A3 Gastrointestinal stromal tumor of small intestine: Secondary | ICD-10-CM | POA: Insufficient documentation

## 2023-02-23 DIAGNOSIS — Z7982 Long term (current) use of aspirin: Secondary | ICD-10-CM | POA: Insufficient documentation

## 2023-02-23 DIAGNOSIS — R519 Headache, unspecified: Secondary | ICD-10-CM

## 2023-02-23 DIAGNOSIS — U071 COVID-19: Secondary | ICD-10-CM

## 2023-02-23 HISTORY — DX: Pre-excitation syndrome: I45.6

## 2023-02-23 LAB — URINALYSIS, ROUTINE W REFLEX MICROSCOPIC
Bacteria, UA: NONE SEEN
Bilirubin Urine: NEGATIVE
Glucose, UA: NEGATIVE mg/dL
Hgb urine dipstick: NEGATIVE
Ketones, ur: NEGATIVE mg/dL
Leukocytes,Ua: NEGATIVE
Nitrite: NEGATIVE
Protein, ur: 30 mg/dL — AB
Specific Gravity, Urine: 1.02 (ref 1.005–1.030)
pH: 7 (ref 5.0–8.0)

## 2023-02-23 LAB — CBC WITH DIFFERENTIAL/PLATELET
Abs Immature Granulocytes: 0.06 10*3/uL (ref 0.00–0.07)
Basophils Absolute: 0 10*3/uL (ref 0.0–0.1)
Basophils Relative: 1 %
Eosinophils Absolute: 0.1 10*3/uL (ref 0.0–0.5)
Eosinophils Relative: 1 %
HCT: 48.1 % (ref 39.0–52.0)
Hemoglobin: 16.6 g/dL (ref 13.0–17.0)
Immature Granulocytes: 1 %
Lymphocytes Relative: 10 %
Lymphs Abs: 0.8 10*3/uL (ref 0.7–4.0)
MCH: 30.1 pg (ref 26.0–34.0)
MCHC: 34.5 g/dL (ref 30.0–36.0)
MCV: 87.3 fL (ref 80.0–100.0)
Monocytes Absolute: 0.7 10*3/uL (ref 0.1–1.0)
Monocytes Relative: 8 %
Neutro Abs: 6.6 10*3/uL (ref 1.7–7.7)
Neutrophils Relative %: 79 %
Platelets: 171 10*3/uL (ref 150–400)
RBC: 5.51 MIL/uL (ref 4.22–5.81)
RDW: 13.2 % (ref 11.5–15.5)
WBC: 8.3 10*3/uL (ref 4.0–10.5)
nRBC: 0 % (ref 0.0–0.2)

## 2023-02-23 LAB — COMPREHENSIVE METABOLIC PANEL
ALT: 12 U/L (ref 0–44)
ALT: 16 U/L (ref 0–44)
AST: 12 U/L — ABNORMAL LOW (ref 15–41)
AST: 21 U/L (ref 15–41)
Albumin: 2.5 g/dL — ABNORMAL LOW (ref 3.5–5.0)
Albumin: 4 g/dL (ref 3.5–5.0)
Alkaline Phosphatase: 30 U/L — ABNORMAL LOW (ref 38–126)
Alkaline Phosphatase: 50 U/L (ref 38–126)
Anion gap: 8 (ref 5–15)
Anion gap: 12 (ref 5–15)
BUN: 9 mg/dL (ref 6–20)
BUN: 11 mg/dL (ref 6–20)
CO2: 17 mmol/L — ABNORMAL LOW (ref 22–32)
CO2: 24 mmol/L (ref 22–32)
Calcium: 5.4 mg/dL — CL (ref 8.9–10.3)
Calcium: 9.6 mg/dL (ref 8.9–10.3)
Chloride: 117 mmol/L — ABNORMAL HIGH (ref 98–111)
Chloride: 100 mmol/L (ref 98–111)
Creatinine, Ser: 0.57 mg/dL — ABNORMAL LOW (ref 0.61–1.24)
Creatinine, Ser: 0.87 mg/dL (ref 0.61–1.24)
GFR, Estimated: >60 mL/min (ref >60)
GFR, Estimated: >60 mL/min (ref >60)
Glucose, Bld: 98 mg/dL (ref 70–99)
Glucose, Bld: 125 mg/dL — ABNORMAL HIGH (ref 70–99)
Potassium: 2.4 mmol/L — CL (ref 3.5–5.1)
Potassium: 4 mmol/L (ref 3.5–5.1)
Sodium: 142 mmol/L (ref 135–145)
Sodium: 136 mmol/L (ref 135–145)
Total Bilirubin: 0.5 mg/dL (ref 0.3–1.2)
Total Bilirubin: 0.8 mg/dL (ref 0.3–1.2)
Total Protein: 4.1 g/dL — ABNORMAL LOW (ref 6.5–8.1)
Total Protein: 6.8 g/dL (ref 6.5–8.1)

## 2023-02-23 LAB — MAGNESIUM: Magnesium: 2.2 mg/dL (ref 1.7–2.4)

## 2023-02-23 LAB — RESP PANEL BY RT-PCR (RSV, FLU A&B, COVID)  RVPGX2
Influenza A by PCR: NEGATIVE
Influenza B by PCR: NEGATIVE
Resp Syncytial Virus by PCR: NEGATIVE
SARS Coronavirus 2 by RT PCR: POSITIVE — AB

## 2023-02-23 LAB — HIV ANTIBODY (ROUTINE TESTING W REFLEX): HIV Screen 4th Generation wRfx: NONREACTIVE

## 2023-02-23 LAB — VITAMIN D 25 HYDROXY (VIT D DEFICIENCY, FRACTURES): Vit D, 25-Hydroxy: 28.49 ng/mL — ABNORMAL LOW (ref 30–100)

## 2023-02-23 MED ORDER — ENOXAPARIN SODIUM 60 MG/0.6ML IJ SOSY
60.0000 mg | PREFILLED_SYRINGE | INTRAMUSCULAR | Status: DC
  Administered 2023-02-24: 60 mg via SUBCUTANEOUS
  Filled 2023-02-23 (×2): qty 0.6

## 2023-02-23 MED ORDER — CALCIUM GLUCONATE-NACL 1-0.675 GM/50ML-% IV SOLN
1.0000 g | Freq: Once | INTRAVENOUS | Status: AC
  Administered 2023-02-23: 1000 mg via INTRAVENOUS
  Filled 2023-02-23: qty 50

## 2023-02-23 MED ORDER — POTASSIUM CHLORIDE CRYS ER 20 MEQ PO TBCR
40.0000 meq | EXTENDED_RELEASE_TABLET | Freq: Once | ORAL | Status: AC
  Administered 2023-02-23: 40 meq via ORAL
  Filled 2023-02-23: qty 2

## 2023-02-23 MED ORDER — MAGNESIUM SULFATE 2 GM/50ML IV SOLN
2.0000 g | Freq: Once | INTRAVENOUS | Status: AC
  Administered 2023-02-23: 2 g via INTRAVENOUS
  Filled 2023-02-23: qty 50

## 2023-02-23 MED ORDER — PROCHLORPERAZINE EDISYLATE 10 MG/2ML IJ SOLN
10.0000 mg | Freq: Once | INTRAMUSCULAR | Status: AC
  Administered 2023-02-23: 10 mg via INTRAVENOUS
  Filled 2023-02-23: qty 2

## 2023-02-23 MED ORDER — DIPHENHYDRAMINE HCL 50 MG/ML IJ SOLN
25.0000 mg | Freq: Once | INTRAMUSCULAR | Status: AC
  Administered 2023-02-23: 25 mg via INTRAVENOUS
  Filled 2023-02-23: qty 1

## 2023-02-23 MED ORDER — LACTATED RINGERS IV BOLUS
1000.0000 mL | Freq: Once | INTRAVENOUS | Status: AC
  Administered 2023-02-23: 1000 mL via INTRAVENOUS

## 2023-02-23 NOTE — Assessment & Plan Note (Signed)
Repeat K 3.9. Suspect possible lab error on admission. S/p K 64mq  - Workup as above

## 2023-02-23 NOTE — ED Notes (Signed)
ED TO INPATIENT HANDOFF REPORT  ED Nurse Name and Phone #: Randall Hiss [phone]  S Name/Age/Gender Ryan Waters 42 y.o. male Room/Bed: 024C/024C  Code Status   Code Status: Full Code  Home/SNF/Other Home Patient oriented to: self Is this baseline? Yes   Triage Complete: Triage complete  Chief Complaint Hypocalcemia [E83.51]  Triage Note Pt BIBEMS for headache with blurred vision. Walked to ambulance. Has a temperature and new cough.   Hx asthma, HTN, stomach cancer, WPW  18 g RAC  210/120 to 180/100 to 176/114 120 hr Cbg 115 95% ra   Allergies No Known Allergies  Level of Care/Admitting Diagnosis ED Disposition     ED Disposition  Admit   Condition  --   Comment  Hospital Area: [facility] [[id number]]  Level of Care: Med-Surg [16]  May place patient in observation at [facility] or [facility] if equivalent level of care is available:: No  Covid Evaluation: Symptomatic Person Under Investigation (PUI) or recent exposure (last 10 days) *Testing Required*  Diagnosis: Hypocalcemia [275.41.ICD-9-CM]  Admitting Physician: Eppie Gibson [id number]  Attending Physician: MCDIARMID, TODD D [[id number]]          B Medical/Surgery History Past Medical History:  Diagnosis Date   Asthma    Family history of pancreatic cancer [id number]   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[place])    Wolff-Parkinson-White syndrome    Past Surgical History:  Procedure Laterality Date   ABDOMINAL SURGERY     CARDIAC ELECTROPHYSIOLOGY MAPPING AND ABLATION     NEUROFIBROMA EXCISION     RIGHT WRIST   SMALL INTESTINE SURGERY     GIST TUMOR     A IV Location/Drains/Wounds Patient Lines/Drains/Airways Status     Active Line/Drains/Airways     Name Placement date Placement time Site Days   Peripheral IV 02/23/23 18 G Right Antecubital 02/23/23  --  Antecubital  less than 1            Intake/Output Last 24 hours  Intake/Output  Summary (Last 24 hours) at 02/23/2023 2120 Last data filed at 02/23/2023 1904 [place] per 24 hour  Intake 1000 ml  Output --  Net 1000 ml    Labs/Imaging Results for orders placed or performed during the hospital encounter of 02/23/23 (from the past 48 hour(s))  Comprehensive metabolic panel     Status: Abnormal   Collection Time: 02/23/23  4:30 PM  Result Value Ref Range   Sodium 142 135 - 145 mmol/L   Potassium 2.4 (LL) 3.5 - 5.1 mmol/L    Comment: CRITICAL RESULT CALLED TO, READ BACK BY AND VERIFIED WITH E,ANELLO RN '@1803'$  02/23/23 E,[place]   Chloride 117 (H) 98 - 111 mmol/L   CO2 17 (L) 22 - 32 mmol/L   Glucose, Bld 98 70 - 99 mg/dL    Comment: Glucose reference range applies only to samples taken after fasting for at least 8 hours.   BUN 9 6 - 20 mg/dL   Creatinine, Ser 0.57 (L) 0.61 - 1.24 mg/dL   Calcium 5.4 (LL) 8.9 - 10.3 mg/dL    Comment: CRITICAL RESULT CALLED TO, READ BACK BY AND VERIFIED WITH E,ANELLO RN '@1803'$  02/23/23 E,[place]   Total Protein 4.1 (L) 6.5 - 8.1 g/dL   Albumin 2.5 (L) 3.5 - 5.0 g/dL   AST 12 (L) 15 - 41 U/L   ALT 12 0 - 44 U/L   Alkaline Phosphatase 30 (L) 38 - 126 U/L  Total Bilirubin 0.5 0.3 - 1.2 mg/dL   GFR, Estimated >60 >60 mL/min    Comment: (NOTE) Calculated using the CKD-EPI Creatinine Equation (2021)    Anion gap 8 5 - 15    Comment: Performed at [facility] [address] [place], [place] [address]  CBC with Differential     Status: None   Collection Time: 02/23/23  4:30 PM  Result Value Ref Range   WBC 8.3 4.0 - 10.5 K/uL   RBC 5.51 4.22 - 5.81 MIL/uL   Hemoglobin 16.6 13.0 - 17.0 g/dL   HCT 48.1 39.0 - 52.0 %   MCV 87.3 80.0 - 100.0 fL   MCH 30.1 26.0 - 34.0 pg   MCHC 34.5 30.0 - 36.0 g/dL   RDW 13.2 11.5 - 15.5 %   Platelets 171 150 - 400 K/uL   nRBC 0.0 0.0 - 0.2 %   Neutrophils Relative % 79 %   Neutro Abs 6.6 1.7 - 7.7 K/uL   Lymphocytes Relative 10 %   Lymphs Abs 0.8 0.7 - 4.0 K/uL   Monocytes Relative 8 %    Monocytes Absolute 0.7 0.1 - 1.0 K/uL   Eosinophils Relative 1 %   Eosinophils Absolute 0.1 0.0 - 0.5 K/uL   Basophils Relative 1 %   Basophils Absolute 0.0 0.0 - 0.1 K/uL   Immature Granulocytes 1 %   Abs Immature Granulocytes 0.06 0.00 - 0.07 K/uL    Comment: Performed at [facility], [address] [place], [place] [address]  Urinalysis, Routine w reflex microscopic -Urine, Random     Status: Abnormal   Collection Time: 02/23/23  4:37 PM  Result Value Ref Range   Color, Urine YELLOW YELLOW   APPearance HAZY (A) CLEAR   Specific Gravity, Urine 1.020 1.005 - 1.030   pH 7.0 5.0 - 8.0   Glucose, UA NEGATIVE NEGATIVE mg/dL   Hgb urine dipstick NEGATIVE NEGATIVE   Bilirubin Urine NEGATIVE NEGATIVE   Ketones, ur NEGATIVE NEGATIVE mg/dL   Protein, ur 30 (A) NEGATIVE mg/dL   Nitrite NEGATIVE NEGATIVE   Leukocytes,Ua NEGATIVE NEGATIVE   RBC / HPF 0-5 0 - 5 RBC/hpf   WBC, UA 0-5 0 - 5 WBC/hpf   Bacteria, UA NONE SEEN NONE SEEN   Squamous Epithelial / HPF 0-5 0 - 5 /HPF   Mucus PRESENT     Comment: Performed at [facility], [place] [address], [place],  [address]   CT Head Wo Contrast  Result Date: 02/23/2023 CLINICAL DATA:  Headache, increasing frequency or severity. Blurred vision. EXAM: CT HEAD WITHOUT CONTRAST TECHNIQUE: Contiguous axial images were obtained from the base of the skull through the vertex without intravenous contrast. RADIATION DOSE REDUCTION: This exam was performed according to the departmental dose-optimization program which includes automated exposure control, adjustment of the mA and/or kV according to patient size and/or use of iterative reconstruction technique. COMPARISON:  Head CT and MRI 04/30/2022 FINDINGS: Brain: There is no evidence of an acute infarct, intracranial hemorrhage, mass, midline shift, or extra-axial fluid collection. The ventricles and sulci are normal. There is a chronically thickened appearance of the optic chiasm with a small  focus of calcification to the left of midline, unchanged from the prior CT. Vascular: No hyperdense vessel. Skull: No fracture or suspicious osseous lesion. Sinuses/Orbits: Visualized paranasal sinuses and mastoid air cells are clear. Unremarkable orbits. Other: None. IMPRESSION: 1. No evidence of acute intracranial abnormality. 2. 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 Electronically Signed   By: Logan Bores M.D.   On: 02/23/2023 17:37    Pending Labs Unresulted Labs (From admission, onward)     Start     Ordered   02/24/23 [id number]  Basic metabolic panel  Tomorrow morning,   R        02/23/23 2007   02/23/23 2015  Calcitonin  Once,   R        02/23/23 2015   02/23/23 2005  Magnesium  Once,   R        02/23/23 2007   02/23/23 2005  Comprehensive metabolic panel  Once,   R        02/23/23 2007   02/23/23 2005  VITAMIN D 25 Hydroxy (Vit-D Deficiency, Fractures)  Once,   R        02/23/23 2007   02/23/23 2005  Calcium, ionized  Once,   R        02/23/23 2007   02/23/23 2005  Parathyroid hormone, intact (no Ca)  Once,   R        02/23/23 2007   02/23/23 2004  HIV Antibody (routine testing w rflx)  (HIV Antibody (Routine testing w reflex) panel)  Once,   R        02/23/23 2007   02/23/23 1728  Resp panel by RT-PCR (RSV, Flu A&B, Covid) Anterior Nasal Swab  Once,   URGENT        02/23/23 1727            Vitals/Pain Today's Vitals   02/23/23 1800 02/23/23 1850 02/23/23 1900 02/23/23 2026  BP: (!) 149/91  112/70   Pulse: (!) 104  (!) 109   Resp: 17  17   Temp:    98.6 F (37 C)  TempSrc:    Oral  SpO2: 96%  96%   Weight:      Height:      PainSc:  0-No pain      Isolation Precautions No active isolations  Medications Medications  enoxaparin (LOVENOX) injection 60 mg (has no administration in time range)  prochlorperazine (COMPAZINE) injection 10 mg  (10 mg Intravenous Given 02/23/23 1736)  lactated ringers bolus 1,000 mL (0 mLs Intravenous Stopped 02/23/23 1904)  diphenhydrAMINE (BENADRYL) injection 25 mg (25 mg Intravenous Given 02/23/23 1736)  magnesium sulfate IVPB 2 g 50 mL (0 g Intravenous Stopped 02/23/23 2019)  potassium chloride SA (KLOR-CON M) CR tablet 40 mEq (40 mEq Oral Given 02/23/23 1855)  calcium gluconate 1 g/ 50 mL sodium chloride IVPB (0 mg Intravenous Stopped 02/23/23 2019)    Mobility walks     Focused Assessments Neuro Assessment Handoff:  Swallow screen pass? Yes          Neuro Assessment:   Neuro Checks:      Has TPA been given? No If patient is a Neuro Trauma and patient is going to OR before floor call report to [facility] nurse: [phone] or [phone]   R Recommendations: See Admitting Provider Note  Report given to:   Additional Notes:  wolf parkinsons white syndrome

## 2023-02-23 NOTE — Assessment & Plan Note (Addendum)
Repeat BMP with lab abnormalities resolved. Ca now 8.8. Vitamin D deficient (28.5). Mg wnl. No lethargy, confusion, seizure activity. EKG without QT prolongation. - PTH, calcitonin pending - If calcitonin is high, will need thyroid imaging - S/p 1g calcium gluconate, further repletion based on repeat levels

## 2023-02-23 NOTE — Assessment & Plan Note (Addendum)
Patient with chronic headaches, feels this is pretty typical of his baseline headache. Improved s/p headache cocktail in ED. Per review of ED nursing notes, he was endorsing blurred vision, though denies this to me now. CT with thickening of the optic chiasm, concerning for possible underlying glioma given his history of  neurofibromatosis. Differential includes tension headache, COVID vs underlying glioma. Benign neuro exam.  - Monitor symptoms - COVID test pending - Tylenol PRN  - Given history of NF, reports of blurred vision, and thickening of the optic chiasm on CT, will order MRI Brain

## 2023-02-23 NOTE — ED Triage Notes (Signed)
Pt BIBEMS for headache with blurred vision. Walked to ambulance. Has a temperature and new cough.   Hx asthma, HTN, stomach cancer, WPW  18 g RAC  210/120 to 180/100 to 176/114 120 hr Cbg 115 95% ra

## 2023-02-23 NOTE — ED Notes (Signed)
Potassium 2.4 & Calcium 5.4  MD made aware.

## 2023-02-23 NOTE — Hospital Course (Signed)
Patient with a history of neurofibromatosis, gastrointestinal stromal tumor s/p small bowel resection, and WPW presented to the [facility] on 3/10 with headache and vision changes. ED workup was significant for CT Head with chronic thickening and calcification of the optic chiasm, underlying glioma not excluded. MRI brain ordered which showed ***.   Initial labs were also significant for marked hypokalemia to 2.4 and hypocalcemia to 5.4 (corrected to 6.6). However, these were resolved on repeat  lab draws suggesting lab error.    Items for PCP Follow-Up

## 2023-02-23 NOTE — ED Provider Notes (Signed)
[facility] Provider Note   CSN: [id number] Arrival date & time: 02/23/23  1624     History  Chief Complaint  Patient presents with   Headache   Hypertension    Ryan Waters is a 42 y.o. male.  This is a 42 year old male with history of neurofibromatosis, gastrointestinal stromal tumor status postchemotherapy presenting to the ED for headache and high blood pressure.  Patient states he had worsening headache with some visual changes today, his visual changes have since resolved but he continues to have a headache.  Endorses new cough and known COVID exposure.  Denies any fevers, chills, chest pain or shortness of breath.    Home Medications Prior to Admission medications   Medication Sig Start Date End Date Taking? Authorizing Provider  amLODipine (NORVASC) 10 MG tablet Take 10 mg by mouth every morning. 06/04/21   [provider]  amphetamine-dextroamphetamine (ADDERALL XR) 10 MG 24 hr capsule Take 10 mg by mouth daily.    [provider]  aspirin EC 81 MG tablet Take 81 mg by mouth daily. Swallow whole.    [provider]  famotidine (PEPCID) 10 MG tablet Take 10 mg by mouth 2 (two) times daily.    [provider]  imatinib (GLEEVEC) 100 MG tablet Take 100 mg by mouth daily. Take with meals and large glass of water.Caution:Chemotherapy    [provider]  tadalafil (CIALIS) 5 MG tablet Take 5 mg by mouth every morning. 07/14/21   [provider]      Allergies    Patient has no known allergies.    Review of Systems   Review of Systems  Constitutional:  Negative for chills and fever.  Respiratory:  Negative for shortness of breath.   Cardiovascular:  Negative for chest pain.  Gastrointestinal:  Negative for abdominal pain.  Neurological:  Positive for dizziness and headaches.    Physical Exam Updated Vital Signs BP 112/70   Pulse (!) 109   Temp 98.8 F (37.1 C) (Oral)    Resp 17   Ht '5\' 7"'$  (1.702 m)   Wt 117.9 kg   SpO2 96%   BMI 40.72 kg/m  Physical Exam Vitals and nursing note reviewed.  Constitutional:      General: He is not in acute distress.    Appearance: He is well-developed. He is not ill-appearing or toxic-appearing.  HENT:     Head: Normocephalic.  Eyes:     General: No visual field deficit.    Extraocular Movements: Extraocular movements intact.     Pupils: Pupils are equal, round, and reactive to light.  Neck:     Meningeal: Brudzinski's sign absent.  Cardiovascular:     Rate and Rhythm: Regular rhythm. Tachycardia present.     Heart sounds: Normal heart sounds. No murmur heard.    No friction rub. No gallop.  Pulmonary:     Effort: Pulmonary effort is normal. No respiratory distress.     Breath sounds: No wheezing or rales.  Chest:     Chest wall: No tenderness.  Abdominal:     Palpations: Abdomen is soft.  Musculoskeletal:     Cervical back: Normal range of motion. No rigidity.  Skin:    General: Skin is warm.     Capillary Refill: Capillary refill takes less than 2 seconds.  Neurological:     Mental Status: He is alert and oriented to person, place, and time.     GCS: GCS  eye subscore is 4. GCS verbal subscore is 5. GCS motor subscore is 6.     Cranial Nerves: No cranial nerve deficit, dysarthria or facial asymmetry.     Sensory: No sensory deficit.     Motor: No weakness.     ED Results / Procedures / Treatments   Labs (all labs ordered are listed, but only abnormal results are displayed) Labs Reviewed  COMPREHENSIVE METABOLIC PANEL - Abnormal; Notable for the following components:      Result Value   Potassium 2.4 (*)    Chloride 117 (*)    CO2 17 (*)    Creatinine, Ser 0.57 (*)    Calcium 5.4 (*)    Total Protein 4.1 (*)    Albumin 2.5 (*)    AST 12 (*)    Alkaline Phosphatase 30 (*)    All other components within normal limits  URINALYSIS, ROUTINE W REFLEX MICROSCOPIC - Abnormal; Notable for the  following components:   APPearance HAZY (*)    Protein, ur 30 (*)    All other components within normal limits  RESP PANEL BY RT-PCR (RSV, FLU A&B, COVID)  RVPGX2  CBC WITH DIFFERENTIAL/PLATELET  MAGNESIUM    EKG None  Radiology CT Head Wo Contrast  Result Date: 02/23/2023 CLINICAL DATA:  Headache, increasing frequency or severity. Blurred vision. EXAM: CT HEAD WITHOUT CONTRAST TECHNIQUE: Contiguous axial images were obtained from the base of the skull through the vertex without intravenous contrast. RADIATION DOSE REDUCTION: This exam was performed according to the departmental dose-optimization program which includes automated exposure control, adjustment of the mA and/or kV according to patient size and/or use of iterative reconstruction technique. COMPARISON:  Head CT and MRI 04/30/2022 FINDINGS: Brain: There is no evidence of an acute infarct, intracranial hemorrhage, mass, midline shift, or extra-axial fluid collection. The ventricles and sulci are normal. There is a chronically thickened appearance of the optic chiasm with a small focus of calcification to the left of midline, unchanged from the prior CT. Vascular: No hyperdense vessel. Skull: No fracture or suspicious osseous lesion. Sinuses/Orbits: Visualized paranasal sinuses and mastoid air cells are clear. Unremarkable orbits. Other: None. IMPRESSION: 1. No evidence of acute intracranial abnormality. 2. Chronic thickening and calcification of the optic chiasm, potentially related to the patient's history of neurofibromatosis and with an underlying glioma not excluded. Electronically Signed   By: Logan Bores M.D.   On: 02/23/2023 17:37    Procedures Procedures    Medications Ordered in ED Medications  magnesium sulfate IVPB 2 g 50 mL (2 g Intravenous New Bag/Given 02/23/23 1854)  calcium gluconate 1 g/ 50 mL sodium chloride IVPB (1,000 mg Intravenous New Bag/Given 02/23/23 1853)  prochlorperazine (COMPAZINE) injection 10 mg (10 mg  Intravenous Given 02/23/23 1736)  lactated ringers bolus 1,000 mL (0 mLs Intravenous Stopped 02/23/23 1904)  diphenhydrAMINE (BENADRYL) injection 25 mg (25 mg Intravenous Given 02/23/23 1736)  potassium chloride SA (KLOR-CON M) CR tablet 40 mEq (40 mEq Oral Given 02/23/23 1855)    ED Course/ Medical Decision Making/ A&P                             Medical Decision Making Patient presents with headache and some visual changes, also has new cough with known COVID exposure.  History is complicated by gastrointestinal stromal tumor as well as neurofibromatosis.  He states his head aches are a little worse today.  Differential diagnosis includes tension headache, migraine, space-occupying  lesion, electrolyte abnormalities, stroke.  His neuroexam is benign, cranial nerves II through XII intact, pupils are equal and reactive bilaterally, extraocular movements intact, finger-nose and heel-to-shin normal, gait normal, low suspicion for acute ischemic stroke.  Will obtain head CT to rule out large space-occupying lesion or intracranial hemorrhage.  Patient treated with 1 L LR, Compazine and Benadryl for headache.  Will obtain screening labs including CBC, CMP and magnesium to evaluate for electrolyte abnormalities.  I personally reviewed and interpreted patient's labs.  Patient has multiple electrolyte abnormalities including a potassium of 2.4, bicarb of 17 with a calcium of 5.4.  Magnesium pending.  Patient given 40 mill equivalents oral potassium, 2 g of mag, 1 g calcium gluconate and will require further replacement.  CBC unremarkable and urinalysis negative.  I personally reviewed and interpreted patient's head CT, no space-occupying lesion, no large territory infarction and no intracranial hemorrhage.  Due to patient's electrolyte abnormalities, EKG ordered, I personally reviewed and interpreted this which shows sinus tachycardia rate 110, PR, QRS, QTc normal, no acute ischemic changes.  He has multiple  electrolyte abnormalities of unknown etiology, he does have history of resection of tumor a couple years ago.  He will require multiple rounds of electrolyte replacement.  I called and discussed this case with the family medicine team who agreed to admit patient to their service for further management.  Patient was stable upon admission to hospital.  Problems Addressed: Hypocalcemia: acute illness or injury that poses a threat to life or bodily functions Hypokalemia: acute illness or injury that poses a threat to life or bodily functions  Amount and/or Complexity of Data Reviewed External Data Reviewed: notes.    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 Labs: ordered. Decision-making details documented in ED Course. Radiology: ordered and independent interpretation performed. Decision-making details documented in ED Course. ECG/medicine tests: ordered and independent interpretation performed. Decision-making details documented in ED Course.  Risk Prescription drug management. Decision regarding hospitalization.         Final Clinical Impression(s) / ED Diagnoses Final diagnoses:  Hypocalcemia  Hypokalemia    Rx / DC Orders ED Discharge Orders     None         Jimmie Molly, MD 02/23/23 1918    Elnora Morrison, MD 02/26/23 [phone]

## 2023-02-23 NOTE — Assessment & Plan Note (Signed)
S/p small bowel resection 2021 and s/p Gleevec chemo. Not currently on any therapy. 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  - Unlikely to be contributing here, but low threshold to involve oncology in his care pending further workup.

## 2023-02-23 NOTE — H&P (Addendum)
Hospital Admission History and Physical Service Pager: [phone]  Patient name: Ryan Waters Medical record number: [id number] Date of Birth: 1981/10/11 Age: 42 y.o. Gender: male  Primary Care Provider: Default, Provider, MD Consultants: None Code Status: Full  Preferred Emergency Contact:  Contact Information     Name Relation Home Work Java Other   [place]   [phone]        Chief Complaint: Headache, found to have marked electrolyte derangements  Assessment and Plan: Ryan Waters is a 42 y.o. male presenting with headaches, found to be markedly hypokalemic and hypomagnesemic . Differential for this patient's presentation of this includes lab error, malabsortion related to his previous partial small bowel resection, hypoparathyroidism, calcinonin-secreting thyroid carcinoma.  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   * Hypocalcemia Corrects to 6.6 for albumin. Unclear etiology. Oddly had normal labs just 12 days ago. Not on diuretics. At risk for malabsorption 2/2 history of small bowel resection for small bowel gastrointestinal stromal tumor. Also at risk for endocrine tumors given neurofibromatosis. Less likely severe vit D deficiency as he tells me he supplements Vit D at home. No lethargy, confusion, seizure activity. EKG without QT prolongation. Physical exam negative for tetany, Trouseau or Chvostek sign.  - Admit to med-surg - Repeat CMP - Check mag, PTH, Vit  D, calcitonin - If calcitonin is high, will need thyroid imaging - S/p 1g calcium gluconate, further repletion based on repeat levels  Headache Patient with chronic headaches, feels this is pretty typical of his baseline headache. Improved s/p headache cocktail in ED. Per review of ED nursing notes, he was endorsing blurred vision, though denies this to me now. CT with thickening of the optic  chiasm, concerning for possible underlying glioma given his history of  neurofibromatosis. Differential includes tension headache, COVID vs underlying glioma. Benign neuro exam.  - Monitor symptoms - COVID test pending - Tylenol PRN  - Given history of NF, reports of blurred vision, and thickening of the optic chiasm on CT, will order MRI Brain  Hypokalemia 2.4 on admission without EKG changes. Differential similar to hypocalcemia as discussed above. S/p 40 mEq oral repletion given in ED. - Repeat chemistry - Further repletion as needed - Workup as above  GIST (gastrointestinal stromal tumor) of small bowel, malignant ([place]) S/p small bowel resection 2021 and s/p Gleevec chemo. Not currently on any therapy. 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  - Unlikely to be contributing here, but low threshold to involve oncology in his care pending further workup.    FEN/GI: Regular diet VTE Prophylaxis: Lovenox  Disposition: Med-Surg  History of Present Illness:  Ryan Waters is a 42 y.o. male presenting with headaches and found to have marked electrolyte derangements as above.    He does have headaches at baseline but felt this headache was worse than usual.  He notes that he does have a recent COVID exposure and thinks that it may be related.  At the time of our evaluation, he was already feeling better after receiving Benadryl and Compazine in the ED.  He denies any phono/photophobia, visual changes, or strength or sensation changes.  In light of the recent COVID exposure, he does also mention a slight cough but no fevers, chills, body aches or other symptoms.  He notes that he does have a history of neurofibromatosis and a history of small  bowel gastrointestinal stromal tumor status postresection and treatment with  Gleevec (imatinib) but he does not currently take any medications.  He only takes some vitamins (vitamin D, vitamin C, and zinc.  He does not drink  much alcohol, maybe 1 beer every now and then.   In the ED, he received a headache cocktail which was helpful. Initial plan per EDP was to check a covid test and send him home but once chemistry resulted with significant derangements, repletion was started and we were called to admit.   Review Of Systems: Per HPI with the following additions:  Review of Systems  Constitutional:  Negative for chills and fever.  Respiratory:  Positive for cough. Negative for shortness of breath.   Cardiovascular:  Negative for chest pain and leg swelling.  Neurological:  Positive for headaches. Negative for sensory change, speech change, focal weakness, seizures and weakness.     Pertinent Past Medical History: Neurofibromatosis and small bowel gastrointestinal stromal tumor Remainder reviewed in history tab.   Pertinent Past Surgical History: Small bowel resection July 2021  Remainder reviewed in history tab.  Pertinent Social History: Tobacco use: No Alcohol use: Minimal, one beer on occasion Other Substance use: None  Pertinent Family History: Remainder reviewed in history tab.   Important Outpatient Medications: None, history of Gleevec for GI Stromal tumor  Remainder reviewed in medication history.   Objective: BP 112/70   Pulse (!) 109   Temp 98.6 F (37 C) (Oral)   Resp 17   Ht '5\' 7"'$  (1.702 m)   Wt 117.9 kg   SpO2 96%   BMI 40.72 kg/m  Exam: General: Well-appearing and NAD Eyes: PERRLA, EOMs intact ENTM: MMM Neck: Supple, without lymphadenopathy Cardiovascular: RRR, no murmur/rub/gallop Respiratory: Normal WOB on RA, lungs clear in all fields Gastrointestinal: Obese, non-tender, non-distended MSK: Without edema or deformity Derm: Diffuse neurofibromas Neuro: Strength and sensation intact throughout, EOMs intact as above. Speech fluent.  Psych: Mood and affect appropriate to situation.   Labs:  CBC BMET  Recent Labs  Lab 02/23/23 1630  WBC 8.3  HGB 16.6  HCT 48.1   PLT 171   Recent Labs  Lab 02/23/23 1630  NA 142  K 2.4*  CL 117*  CO2 17*  BUN 9  CREATININE 0.57*  GLUCOSE 98  CALCIUM 5.4*    Pertinent additional labs Albumin 2.5, Co2 17. Respiratory panel pending.    EKG: Sinus rhythm, QRS changes consistent with fascicular block, no ST elevations, T wave changes consistent with known WPW, QTc 425   Imaging Studies Performed: CT Head  IMPRESSION: 1. No evidence of acute intracranial abnormality. 2. Chronic thickening and calcification of the optic chiasm, potentially related to the patient's history of neurofibromatosis and with an underlying glioma not excluded.     Eppie Gibson, MD 02/23/2023, 9:18 PM PGY-2, [place] Intern pager: [phone], text pages welcome Secure chat group [place]

## 2023-02-24 ENCOUNTER — Observation Stay (HOSPITAL_COMMUNITY): Payer: BC Managed Care – PPO

## 2023-02-24 DIAGNOSIS — U071 COVID-19: Secondary | ICD-10-CM | POA: Diagnosis not present

## 2023-02-24 LAB — BASIC METABOLIC PANEL
Anion gap: 11 (ref 5–15)
BUN: 12 mg/dL (ref 6–20)
CO2: 21 mmol/L — ABNORMAL LOW (ref 22–32)
Calcium: 8.8 mg/dL — ABNORMAL LOW (ref 8.9–10.3)
Chloride: 103 mmol/L (ref 98–111)
Creatinine, Ser: 0.85 mg/dL (ref 0.61–1.24)
GFR, Estimated: >60 mL/min (ref >60)
Glucose, Bld: 111 mg/dL — ABNORMAL HIGH (ref 70–99)
Potassium: 3.9 mmol/L (ref 3.5–5.1)
Sodium: 135 mmol/L (ref 135–145)

## 2023-02-24 MED ORDER — PAXLOVID (300/100) 20 X 150 MG & 10 X 100MG PO TBPK: 3.0000 | ORAL_TABLET | Freq: Two times a day (BID) | ORAL | 0 refills | Status: AC

## 2023-02-24 MED ORDER — GADOBUTROL 1 MMOL/ML IV SOLN
10.0000 mL | Freq: Once | INTRAVENOUS | Status: AC | PRN
  Administered 2023-02-24: 10 mL via INTRAVENOUS

## 2023-02-24 NOTE — Progress Notes (Cosign Needed)
  Transition of Care [facility]) Screening Note   Patient Details  Name: Ryan Waters Date of Birth: 07-10-1981   Transition of Care [facility]) CM/SW Contact:    Cyndi Bender, RN Phone Number: 02/24/2023, 9:10 AM    Transition of Care Department [facility]) has reviewed patient and no TOC needs have been identified at this time. We will continue to monitor patient advancement through interdisciplinary progression rounds. If new patient transition needs arise, please place a TOC consult.

## 2023-02-24 NOTE — Plan of Care (Signed)
  Problem: Education: Goal: Knowledge of General Education information will improve Description: Including pain rating scale, medication(s)/side effects and non-pharmacologic comfort measures Outcome: Progressing   Problem: Activity: Goal: Risk for activity intolerance will decrease Outcome: Progressing   Problem: Clinical Measurements: Goal: Respiratory complications will improve Outcome: Progressing   Problem: Nutrition: Goal: Adequate nutrition will be maintained Outcome: Progressing   Problem: Coping: Goal: Level of anxiety will decrease Outcome: Progressing   Problem: Elimination: Goal: Will not experience complications related to bowel motility Outcome: Progressing Goal: Will not experience complications related to urinary retention Outcome: Progressing   Problem: Skin Integrity: Goal: Risk for impaired skin integrity will decrease Outcome: Progressing   Problem: Safety: Goal: Ability to remain free from injury will improve Outcome: Progressing

## 2023-02-24 NOTE — Discharge Instructions (Signed)
Dear Sylvester [place],   Thank you so much for allowing [place] to be part of your care!  You were admitted to [facility] for your abnormal imaging and lab findings, and also treated you for your headache  We have prescribed Paxlovid for your COVID infection which you will take for 5 days   POST-HOSPITAL & CARE INSTRUCTIONS Please let PCP/Specialists know of any changes that were made.  Please see medications section of this packet for any medication changes.   DOCTOR'S APPOINTMENT & FOLLOW UP CARE INSTRUCTIONS  Future Appointments  Date Time Provider [place]  08/20/2023  9:30 AM Marice Potter, MD [facility] None    RETURN PRECAUTIONS:   Take care and be well!  [facility]  [place], [place] [address] [phone]

## 2023-02-24 NOTE — Discharge Summary (Addendum)
[facility] Discharge Summary  Patient name: Ryan Waters Medical record number: [id number] Date of birth: 11/13/1981 Age: 42 y.o. Gender: male Date of Admission: 02/23/2023  Date of Discharge: 02/24/23 Admitting Physician: Eppie Gibson, MD  Primary Care Provider: Default, Provider, MD Consultants: none  Indication for Hospitalization: electrolyte abnormalities  Brief Hospital Course:  Patient with a history of neurofibromatosis, gastrointestinal stromal tumor s/p small bowel resection, and WPW presented to the [facility] on 3/10 with headache and vision changes. ED workup was significant for CT Head with chronic thickening and calcification of the optic chiasm, underlying glioma not excluded. 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 Faint enhancement possible (series 20, image 9), although no mass effect to suggest low grade glioma at this time. No optic pathway glioma identified. No other abnormal intracranial enhancement or acute intracranial abnormality." Patient ultimately tested positive for COVID which was the presumed cause of his headaches. He was prescribed a 5 day course of Paxlovid.   Initial labs were also significant for marked hypokalemia to 2.4 and hypocalcemia to 5.4 (corrected to 6.6). However, these were resolved on repeat lab draws suggesting lab error. Workup for hypocalcemia, including, PTH and calcitonin were pending at the time of discharge.   Items for PCP Follow-Up Will need routine MRI Brain to follow-up faint enhancement noted on scan here.  Follow up PTH and calcitonin labs outpatient.  Recheck BMP outpatient   Discharge Diagnoses/Problem List:  Hypocalcemia Repeat BMP with lab abnormalities resolved. Ca now 8.8. Vitamin D deficient (28.5). Mg wnl. No lethargy, confusion, seizure activity. EKG without QT prolongation. -  PTH, calcitonin pending - If calcitonin is high, will need thyroid imaging - S/p 1g calcium gluconate, further repletion based on repeat levels  Headache Patient with chronic headaches, feels this is pretty typical of his baseline headache. CT with thickening of the optic chiasm, concerning for possible underlying glioma given his history of neurofibromatosis. Likely due to COVID. MRI brain without optic glioma.  - Monitor symptoms - Tylenol PRN   Hypokalemia Repeat K 3.9. Suspect possible lab error on admission. S/p K 62mq  - Workup as above  Stromal [place]/p [place] bowel resection 2021 and s/p Gleevec chemo. Not currently on any therapy. 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  - Unlikely to be contributing here, but low threshold to involve oncology in his care pending further workup.   Disposition: Home  Discharge Condition: Stable  Discharge Exam:  General: in no acute distress Cardiovascular: RRR. No m/r/g. Respiratory: CTAB. Abdomen: protuberant, soft. No tenderness to palpation.  Ext: no erythema, tenderness.   Issues for Follow Up:  Will need routine MRI Brain to follow-up faint enhancement noted on scan here.   Significant Procedures: None  Significant Labs and Imaging:  Recent Labs  Lab 02/23/23 1630  WBC 8.3  HGB 16.6  HCT 48.1  PLT 171   Recent Labs  Lab 02/23/23 1630 02/23/23 2035 02/24/23 0357  NA 142 136 135  K 2.4* 4.0 3.9  CL 117* 100 103  CO2 17* 24 21*  GLUCOSE 98 125* 111*  BUN '9 11 12  '$ CREATININE 0.57* 0.87 0.85  CALCIUM 5.4* 9.6 8.8*  MG  --  2.2  --   ALKPHOS 30* 50  --   AST 12* 21  --   ALT 12  16  --   ALBUMIN 2.5* 4.0  --     Results/Tests Pending at Time of Discharge: PTH, calcitonin  Discharge Medications:  Allergies as of 02/24/2023   No Known Allergies      Medication List     TAKE these medications    ibuprofen 200 MG tablet Commonly  known as: ADVIL Take 400-600 mg by mouth as needed for moderate pain.   Paxlovid (300/100) 20 x 150 MG & 10 x '100MG'$  Tbpk Generic drug: nirmatrelvir & ritonavir Take 3 tablets by mouth 2 (two) times daily. Take two tablets of the Nirmatrelvir (300 mg total) AND one tablet of the ritonavir (100 mg total) with food twice daily for five days   VITAMIN C PO Take 1 tablet by mouth daily.   VITAMIN D PO Take 1 capsule by mouth daily.   ZINC PO Take 1 tablet by mouth daily.        Discharge Instructions: Please refer to Patient Instructions section of EMR for full details.  Patient was counseled important signs and symptoms that should prompt return to medical care, changes in medications, dietary instructions, activity restrictions, and follow up appointments.   Follow-Up Appointments:  Rolanda Lundborg, MD 02/24/2023, 11:58 AM PGY-1, [place]

## 2023-02-25 LAB — PARATHYROID HORMONE, INTACT (NO CA): PTH: 13 pg/mL — ABNORMAL LOW (ref 15–65)

## 2023-02-25 LAB — CALCITONIN: Calcitonin: 34.6 pg/mL — ABNORMAL HIGH (ref 0.0–8.4)

## 2023-02-25 LAB — CALCIUM, IONIZED: Calcium, Ionized, Serum: 5.1 mg/dL (ref 4.5–5.6)

## 2023-08-20 ENCOUNTER — Inpatient Hospital Stay: Payer: Self-pay | Admitting: Oncology
